# Patient Record
Sex: Male | Born: 1940 | Race: White | Hispanic: No | Smoking: Current every day smoker
Health system: Southern US, Community
[De-identification: ages and names within clinical notes are randomized; demographics above are authoritative.]

## PROBLEM LIST (undated history)

## (undated) DIAGNOSIS — F32A Depression, unspecified: Secondary | ICD-10-CM

## (undated) DIAGNOSIS — F419 Anxiety disorder, unspecified: Secondary | ICD-10-CM

## (undated) DIAGNOSIS — H269 Unspecified cataract: Secondary | ICD-10-CM

## (undated) DIAGNOSIS — T7840XA Allergy, unspecified, initial encounter: Secondary | ICD-10-CM

## (undated) DIAGNOSIS — F329 Major depressive disorder, single episode, unspecified: Secondary | ICD-10-CM

## (undated) DIAGNOSIS — L309 Dermatitis, unspecified: Secondary | ICD-10-CM

## (undated) DIAGNOSIS — F0390 Unspecified dementia without behavioral disturbance: Secondary | ICD-10-CM

## (undated) DIAGNOSIS — Z720 Tobacco use: Secondary | ICD-10-CM

## (undated) HISTORY — DX: Depression, unspecified: F32.A

## (undated) HISTORY — DX: Anxiety disorder, unspecified: F41.9

## (undated) HISTORY — DX: Major depressive disorder, single episode, unspecified: F32.9

## (undated) HISTORY — DX: Unspecified cataract: H26.9

## (undated) HISTORY — DX: Allergy, unspecified, initial encounter: T78.40XA

---

## 2014-05-14 ENCOUNTER — Emergency Department (HOSPITAL_COMMUNITY)
Admission: EM | Admit: 2014-05-14 | Discharge: 2014-05-14 | Disposition: A | Discharge status: Home / Self Care | Payer: Medicare Other | Attending: Emergency Medicine | Admitting: Emergency Medicine

## 2014-05-14 ENCOUNTER — Encounter (HOSPITAL_COMMUNITY): Payer: Self-pay | Admitting: Emergency Medicine

## 2014-05-14 DIAGNOSIS — T24211A Burn of second degree of right thigh, initial encounter: Secondary | ICD-10-CM | POA: Insufficient documentation

## 2014-05-14 DIAGNOSIS — W868XXA Exposure to other electric current, initial encounter: Secondary | ICD-10-CM | POA: Insufficient documentation

## 2014-05-14 DIAGNOSIS — Z720 Tobacco use: Secondary | ICD-10-CM | POA: Diagnosis not present

## 2014-05-14 DIAGNOSIS — Y92092 Bedroom in other non-institutional residence as the place of occurrence of the external cause: Secondary | ICD-10-CM | POA: Insufficient documentation

## 2014-05-14 DIAGNOSIS — Y9389 Activity, other specified: Secondary | ICD-10-CM | POA: Diagnosis not present

## 2014-05-14 DIAGNOSIS — T23002A Burn of unspecified degree of left hand, unspecified site, initial encounter: Secondary | ICD-10-CM | POA: Diagnosis present

## 2014-05-14 DIAGNOSIS — Y998 Other external cause status: Secondary | ICD-10-CM | POA: Diagnosis not present

## 2014-05-14 DIAGNOSIS — T23302A Burn of third degree of left hand, unspecified site, initial encounter: Secondary | ICD-10-CM | POA: Insufficient documentation

## 2014-05-14 MED ORDER — HYDROCODONE-ACETAMINOPHEN 5-325 MG PO TABS: 1.0000 | ORAL_TABLET | ORAL | Status: DC | PRN

## 2014-05-14 MED ORDER — SILVER SULFADIAZINE 1 % EX CREA
TOPICAL_CREAM | Freq: Once | CUTANEOUS | Status: AC
  Administered 2014-05-14: 3 via TOPICAL
  Filled 2014-05-14: qty 50

## 2014-05-14 MED ORDER — IBUPROFEN 600 MG PO TABS: 600.0000 mg | ORAL_TABLET | Freq: Four times a day (QID) | ORAL | Status: DC | PRN

## 2014-05-14 MED ORDER — SILVER SULFADIAZINE 1 % EX CREA: 1.0000 "application " | TOPICAL_CREAM | Freq: Every day | CUTANEOUS | Status: DC

## 2014-05-14 MED ORDER — HYDROCODONE-ACETAMINOPHEN 5-325 MG PO TABS
2.0000 | ORAL_TABLET | Freq: Once | ORAL | Status: AC
Start: 2014-05-14 — End: 2014-05-14
  Administered 2014-05-14: 2 via ORAL
  Filled 2014-05-14: qty 2

## 2014-05-14 MED ORDER — IBUPROFEN 800 MG PO TABS
800.0000 mg | ORAL_TABLET | Freq: Once | ORAL | Status: AC
  Administered 2014-05-14: 800 mg via ORAL
  Filled 2014-05-14: qty 1
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
## 2014-05-14 NOTE — Discharge Instructions (Signed)
Burn Care Your skin is a natural barrier to infection. It is the largest organ of your body. Burns damage this natural protection. To help prevent infection, it is very important to follow your caregiver's instructions in the care of your burn. Burns are classified as:  First degree. There is only redness of the skin (erythema). No scarring is expected.  Second degree. There is blistering of the skin. Scarring may occur with deeper burns.  Third degree. All layers of the skin are injured, and scarring is expected. HOME CARE INSTRUCTIONS   Wash your hands well before changing your bandage.  Change your bandage as often as directed by your caregiver.  Remove the old bandage. If the bandage sticks, you may soak it off with cool, clean water.  Cleanse the burn thoroughly but gently with mild soap and water.  Pat the area dry with a clean, dry cloth.  Apply a thin layer of antibacterial cream to the burn.  Apply a clean bandage as instructed by your caregiver.  Keep the bandage as clean and dry as possible.  Elevate the affected area for the first 24 hours, then as instructed by your caregiver.  Only take over-the-counter or prescription medicines for pain, discomfort, or fever as directed by your caregiver. SEEK IMMEDIATE MEDICAL CARE IF:   You develop excessive pain.  You develop redness, tenderness, swelling, or red streaks near the burn.  The burned area develops yellowish-white fluid (pus) or a bad smell.  You have a fever. MAKE SURE YOU:   Understand these instructions.  Will watch your condition.  Will get help right away if you are not doing well or get worse. Document Released: 05/11/2005 Document Revised: 08/03/2011 Document Reviewed: 10/01/2010 ExitCare Patient Information 2015 [employer]. This information is not intended to replace advice given to you by your health care provider. Make sure you discuss any questions you have with your health care  provider.  
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
## 2014-05-25 ENCOUNTER — Ambulatory Visit (INDEPENDENT_AMBULATORY_CARE_PROVIDER_SITE_OTHER): Payer: Managed Care, Other (non HMO) | Admitting: Emergency Medicine

## 2014-05-25 VITALS — BP 124/62 | HR 87 | Temp 97.9°F | Resp 18 | Ht 71.0 in | Wt 169.4 lb

## 2014-05-25 DIAGNOSIS — T23202A Burn of second degree of left hand, unspecified site, initial encounter: Secondary | ICD-10-CM

## 2014-05-25 DIAGNOSIS — Z23 Encounter for immunization: Secondary | ICD-10-CM

## 2014-05-25 DIAGNOSIS — T24201A Burn of second degree of unspecified site of right lower limb, except ankle and foot, initial encounter: Secondary | ICD-10-CM

## 2014-05-25 MED ORDER — SILVER SULFADIAZINE 1 % EX CREA: 1.0000 "application " | TOPICAL_CREAM | Freq: Every day | CUTANEOUS | Status: DC
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
## 2014-05-25 NOTE — Patient Instructions (Signed)
Burn Care Your skin is a natural barrier to infection. It is the largest organ of your body. Burns damage this natural protection. To help prevent infection, it is very important to follow your caregiver's instructions in the care of your burn. Burns are classified as:  First degree. There is only redness of the skin (erythema). No scarring is expected.  Second degree. There is blistering of the skin. Scarring may occur with deeper burns.  Third degree. All layers of the skin are injured, and scarring is expected. HOME CARE INSTRUCTIONS   Wash your hands well before changing your bandage.  Change your bandage as often as directed by your caregiver.  Remove the old bandage. If the bandage sticks, you may soak it off with cool, clean water.  Cleanse the burn thoroughly but gently with mild soap and water.  Pat the area dry with a clean, dry cloth.  Apply a thin layer of antibacterial cream to the burn.  Apply a clean bandage as instructed by your caregiver.  Keep the bandage as clean and dry as possible.  Elevate the affected area for the first 24 hours, then as instructed by your caregiver.  Only take over-the-counter or prescription medicines for pain, discomfort, or fever as directed by your caregiver. SEEK IMMEDIATE MEDICAL CARE IF:   You develop excessive pain.  You develop redness, tenderness, swelling, or red streaks near the burn.  The burned area develops yellowish-white fluid (pus) or a bad smell.  You have a fever. MAKE SURE YOU:   Understand these instructions.  Will watch your condition.  Will get help right away if you are not doing well or get worse. Document Released: 05/11/2005 Document Revised: 08/03/2011 Document Reviewed: 10/01/2010 Exit[employer] Patient Information 2015 [employer]. This information is not intended to replace advice given to you by your health care provider. Make sure you discuss any questions you have with your health care  provider.  

## 2015-10-15 DIAGNOSIS — Z0000 Encounter for general adult medical examination without abnormal findings: Secondary | ICD-10-CM | POA: Diagnosis not present

## 2015-10-15 DIAGNOSIS — Z6824 Body mass index (BMI) 24.0-24.9, adult: Secondary | ICD-10-CM | POA: Diagnosis not present

## 2016-05-06 ENCOUNTER — Ambulatory Visit: Payer: Self-pay | Admitting: Family Medicine

## 2016-06-13 DIAGNOSIS — Z0000 Encounter for general adult medical examination without abnormal findings: Secondary | ICD-10-CM | POA: Diagnosis not present

## 2016-06-13 DIAGNOSIS — G3184 Mild cognitive impairment, so stated: Secondary | ICD-10-CM | POA: Diagnosis not present

## 2016-06-13 DIAGNOSIS — R69 Illness, unspecified: Secondary | ICD-10-CM | POA: Diagnosis not present

## 2016-06-13 DIAGNOSIS — Z972 Presence of dental prosthetic device (complete) (partial): Secondary | ICD-10-CM | POA: Diagnosis not present

## 2016-06-13 DIAGNOSIS — K08409 Partial loss of teeth, unspecified cause, unspecified class: Secondary | ICD-10-CM | POA: Diagnosis not present

## 2016-06-13 DIAGNOSIS — R030 Elevated blood-pressure reading, without diagnosis of hypertension: Secondary | ICD-10-CM | POA: Diagnosis not present

## 2016-06-13 DIAGNOSIS — Z6823 Body mass index (BMI) 23.0-23.9, adult: Secondary | ICD-10-CM | POA: Diagnosis not present

## 2016-07-08 ENCOUNTER — Ambulatory Visit: Payer: Self-pay | Admitting: Family Medicine

## 2016-08-12 ENCOUNTER — Ambulatory Visit: Payer: Self-pay | Admitting: Family Medicine

## 2016-08-13 ENCOUNTER — Telehealth: Payer: Self-pay | Admitting: General Practice

## 2016-08-13 NOTE — Telephone Encounter (Signed)
Patient was No Show for New Patient appointment 08/12/16, 07/08/16 and 05/06/16.

## 2016-08-13 NOTE — Telephone Encounter (Signed)
Ok- do not reschedule please.

## 2016-08-14 NOTE — Telephone Encounter (Signed)
Patient BLOCKED from scheduling with provider again

## 2017-04-30 DIAGNOSIS — I10 Essential (primary) hypertension: Secondary | ICD-10-CM | POA: Diagnosis not present

## 2017-04-30 DIAGNOSIS — D229 Melanocytic nevi, unspecified: Secondary | ICD-10-CM | POA: Diagnosis not present

## 2017-04-30 DIAGNOSIS — L404 Guttate psoriasis: Secondary | ICD-10-CM | POA: Diagnosis not present

## 2017-04-30 DIAGNOSIS — R69 Illness, unspecified: Secondary | ICD-10-CM | POA: Diagnosis not present

## 2017-04-30 DIAGNOSIS — Z23 Encounter for immunization: Secondary | ICD-10-CM | POA: Diagnosis not present

## 2017-05-10 DIAGNOSIS — D229 Melanocytic nevi, unspecified: Secondary | ICD-10-CM | POA: Diagnosis not present

## 2017-05-10 DIAGNOSIS — Z720 Tobacco use: Secondary | ICD-10-CM | POA: Diagnosis not present

## 2017-05-10 DIAGNOSIS — L821 Other seborrheic keratosis: Secondary | ICD-10-CM | POA: Diagnosis not present

## 2017-05-10 DIAGNOSIS — R69 Illness, unspecified: Secondary | ICD-10-CM | POA: Diagnosis not present

## 2017-05-10 DIAGNOSIS — Z9189 Other specified personal risk factors, not elsewhere classified: Secondary | ICD-10-CM | POA: Diagnosis not present

## 2017-05-10 DIAGNOSIS — R531 Weakness: Secondary | ICD-10-CM | POA: Diagnosis not present

## 2017-05-12 DIAGNOSIS — R488 Other symbolic dysfunctions: Secondary | ICD-10-CM | POA: Diagnosis not present

## 2017-05-12 DIAGNOSIS — R2681 Unsteadiness on feet: Secondary | ICD-10-CM | POA: Diagnosis not present

## 2017-05-12 DIAGNOSIS — R278 Other lack of coordination: Secondary | ICD-10-CM | POA: Diagnosis not present

## 2017-05-12 DIAGNOSIS — M6281 Muscle weakness (generalized): Secondary | ICD-10-CM | POA: Diagnosis not present

## 2017-05-13 DIAGNOSIS — Z720 Tobacco use: Secondary | ICD-10-CM | POA: Diagnosis not present

## 2017-05-13 DIAGNOSIS — R69 Illness, unspecified: Secondary | ICD-10-CM | POA: Diagnosis not present

## 2017-05-13 DIAGNOSIS — Z0001 Encounter for general adult medical examination with abnormal findings: Secondary | ICD-10-CM | POA: Diagnosis not present

## 2017-05-14 DIAGNOSIS — M6281 Muscle weakness (generalized): Secondary | ICD-10-CM | POA: Diagnosis not present

## 2017-05-14 DIAGNOSIS — R278 Other lack of coordination: Secondary | ICD-10-CM | POA: Diagnosis not present

## 2017-05-14 DIAGNOSIS — R488 Other symbolic dysfunctions: Secondary | ICD-10-CM | POA: Diagnosis not present

## 2017-05-14 DIAGNOSIS — R2681 Unsteadiness on feet: Secondary | ICD-10-CM | POA: Diagnosis not present

## 2017-05-17 DIAGNOSIS — R488 Other symbolic dysfunctions: Secondary | ICD-10-CM | POA: Diagnosis not present

## 2017-05-17 DIAGNOSIS — M6281 Muscle weakness (generalized): Secondary | ICD-10-CM | POA: Diagnosis not present

## 2017-05-17 DIAGNOSIS — R278 Other lack of coordination: Secondary | ICD-10-CM | POA: Diagnosis not present

## 2017-05-17 DIAGNOSIS — R2681 Unsteadiness on feet: Secondary | ICD-10-CM | POA: Diagnosis not present

## 2017-05-19 DIAGNOSIS — R278 Other lack of coordination: Secondary | ICD-10-CM | POA: Diagnosis not present

## 2017-05-19 DIAGNOSIS — R2681 Unsteadiness on feet: Secondary | ICD-10-CM | POA: Diagnosis not present

## 2017-05-19 DIAGNOSIS — M6281 Muscle weakness (generalized): Secondary | ICD-10-CM | POA: Diagnosis not present

## 2017-05-19 DIAGNOSIS — R488 Other symbolic dysfunctions: Secondary | ICD-10-CM | POA: Diagnosis not present

## 2017-05-20 DIAGNOSIS — R2681 Unsteadiness on feet: Secondary | ICD-10-CM | POA: Diagnosis not present

## 2017-05-20 DIAGNOSIS — M6281 Muscle weakness (generalized): Secondary | ICD-10-CM | POA: Diagnosis not present

## 2017-05-20 DIAGNOSIS — R278 Other lack of coordination: Secondary | ICD-10-CM | POA: Diagnosis not present

## 2017-05-20 DIAGNOSIS — R488 Other symbolic dysfunctions: Secondary | ICD-10-CM | POA: Diagnosis not present

## 2017-05-21 DIAGNOSIS — R488 Other symbolic dysfunctions: Secondary | ICD-10-CM | POA: Diagnosis not present

## 2017-05-21 DIAGNOSIS — M6281 Muscle weakness (generalized): Secondary | ICD-10-CM | POA: Diagnosis not present

## 2017-05-21 DIAGNOSIS — R2681 Unsteadiness on feet: Secondary | ICD-10-CM | POA: Diagnosis not present

## 2017-05-21 DIAGNOSIS — R278 Other lack of coordination: Secondary | ICD-10-CM | POA: Diagnosis not present

## 2017-05-24 DIAGNOSIS — R278 Other lack of coordination: Secondary | ICD-10-CM | POA: Diagnosis not present

## 2017-05-24 DIAGNOSIS — R2681 Unsteadiness on feet: Secondary | ICD-10-CM | POA: Diagnosis not present

## 2017-05-24 DIAGNOSIS — R488 Other symbolic dysfunctions: Secondary | ICD-10-CM | POA: Diagnosis not present

## 2017-05-24 DIAGNOSIS — M6281 Muscle weakness (generalized): Secondary | ICD-10-CM | POA: Diagnosis not present

## 2017-05-31 DIAGNOSIS — M6281 Muscle weakness (generalized): Secondary | ICD-10-CM | POA: Diagnosis not present

## 2017-05-31 DIAGNOSIS — R278 Other lack of coordination: Secondary | ICD-10-CM | POA: Diagnosis not present

## 2017-05-31 DIAGNOSIS — R2681 Unsteadiness on feet: Secondary | ICD-10-CM | POA: Diagnosis not present

## 2017-05-31 DIAGNOSIS — R488 Other symbolic dysfunctions: Secondary | ICD-10-CM | POA: Diagnosis not present

## 2017-06-01 DIAGNOSIS — R488 Other symbolic dysfunctions: Secondary | ICD-10-CM | POA: Diagnosis not present

## 2017-06-01 DIAGNOSIS — R278 Other lack of coordination: Secondary | ICD-10-CM | POA: Diagnosis not present

## 2017-06-01 DIAGNOSIS — R2681 Unsteadiness on feet: Secondary | ICD-10-CM | POA: Diagnosis not present

## 2017-06-01 DIAGNOSIS — M6281 Muscle weakness (generalized): Secondary | ICD-10-CM | POA: Diagnosis not present

## 2017-06-02 DIAGNOSIS — M6281 Muscle weakness (generalized): Secondary | ICD-10-CM | POA: Diagnosis not present

## 2017-06-02 DIAGNOSIS — R2681 Unsteadiness on feet: Secondary | ICD-10-CM | POA: Diagnosis not present

## 2017-06-02 DIAGNOSIS — R488 Other symbolic dysfunctions: Secondary | ICD-10-CM | POA: Diagnosis not present

## 2017-06-02 DIAGNOSIS — R278 Other lack of coordination: Secondary | ICD-10-CM | POA: Diagnosis not present

## 2017-06-21 DIAGNOSIS — D229 Melanocytic nevi, unspecified: Secondary | ICD-10-CM | POA: Diagnosis not present

## 2017-06-21 DIAGNOSIS — L720 Epidermal cyst: Secondary | ICD-10-CM | POA: Diagnosis not present

## 2017-06-21 DIAGNOSIS — R69 Illness, unspecified: Secondary | ICD-10-CM | POA: Diagnosis not present

## 2017-06-22 DIAGNOSIS — R2681 Unsteadiness on feet: Secondary | ICD-10-CM | POA: Diagnosis not present

## 2017-06-22 DIAGNOSIS — R278 Other lack of coordination: Secondary | ICD-10-CM | POA: Diagnosis not present

## 2017-06-22 DIAGNOSIS — M6281 Muscle weakness (generalized): Secondary | ICD-10-CM | POA: Diagnosis not present

## 2017-06-22 DIAGNOSIS — R488 Other symbolic dysfunctions: Secondary | ICD-10-CM | POA: Diagnosis not present

## 2017-06-24 DIAGNOSIS — R2681 Unsteadiness on feet: Secondary | ICD-10-CM | POA: Diagnosis not present

## 2017-06-24 DIAGNOSIS — R278 Other lack of coordination: Secondary | ICD-10-CM | POA: Diagnosis not present

## 2017-06-24 DIAGNOSIS — M6281 Muscle weakness (generalized): Secondary | ICD-10-CM | POA: Diagnosis not present

## 2017-06-24 DIAGNOSIS — R488 Other symbolic dysfunctions: Secondary | ICD-10-CM | POA: Diagnosis not present

## 2017-06-28 DIAGNOSIS — M6281 Muscle weakness (generalized): Secondary | ICD-10-CM | POA: Diagnosis not present

## 2017-06-28 DIAGNOSIS — R2681 Unsteadiness on feet: Secondary | ICD-10-CM | POA: Diagnosis not present

## 2017-06-28 DIAGNOSIS — R488 Other symbolic dysfunctions: Secondary | ICD-10-CM | POA: Diagnosis not present

## 2017-06-28 DIAGNOSIS — R278 Other lack of coordination: Secondary | ICD-10-CM | POA: Diagnosis not present

## 2017-06-29 DIAGNOSIS — L570 Actinic keratosis: Secondary | ICD-10-CM | POA: Diagnosis not present

## 2017-06-29 DIAGNOSIS — L989 Disorder of the skin and subcutaneous tissue, unspecified: Secondary | ICD-10-CM | POA: Diagnosis not present

## 2017-06-29 DIAGNOSIS — D229 Melanocytic nevi, unspecified: Secondary | ICD-10-CM | POA: Diagnosis not present

## 2017-06-29 DIAGNOSIS — L821 Other seborrheic keratosis: Secondary | ICD-10-CM | POA: Diagnosis not present

## 2017-06-29 DIAGNOSIS — L118 Other specified acantholytic disorders: Secondary | ICD-10-CM | POA: Diagnosis not present

## 2017-06-29 DIAGNOSIS — R69 Illness, unspecified: Secondary | ICD-10-CM | POA: Diagnosis not present

## 2017-06-29 DIAGNOSIS — D225 Melanocytic nevi of trunk: Secondary | ICD-10-CM | POA: Diagnosis not present

## 2017-06-30 DIAGNOSIS — M6281 Muscle weakness (generalized): Secondary | ICD-10-CM | POA: Diagnosis not present

## 2017-06-30 DIAGNOSIS — R488 Other symbolic dysfunctions: Secondary | ICD-10-CM | POA: Diagnosis not present

## 2017-06-30 DIAGNOSIS — R278 Other lack of coordination: Secondary | ICD-10-CM | POA: Diagnosis not present

## 2017-06-30 DIAGNOSIS — R2681 Unsteadiness on feet: Secondary | ICD-10-CM | POA: Diagnosis not present

## 2017-07-06 DIAGNOSIS — L988 Other specified disorders of the skin and subcutaneous tissue: Secondary | ICD-10-CM | POA: Diagnosis not present

## 2017-07-06 DIAGNOSIS — D229 Melanocytic nevi, unspecified: Secondary | ICD-10-CM | POA: Diagnosis not present

## 2017-07-06 DIAGNOSIS — M6281 Muscle weakness (generalized): Secondary | ICD-10-CM | POA: Diagnosis not present

## 2017-07-06 DIAGNOSIS — Z720 Tobacco use: Secondary | ICD-10-CM | POA: Diagnosis not present

## 2017-07-06 DIAGNOSIS — R278 Other lack of coordination: Secondary | ICD-10-CM | POA: Diagnosis not present

## 2017-07-06 DIAGNOSIS — L570 Actinic keratosis: Secondary | ICD-10-CM | POA: Diagnosis not present

## 2017-07-06 DIAGNOSIS — R488 Other symbolic dysfunctions: Secondary | ICD-10-CM | POA: Diagnosis not present

## 2017-07-06 DIAGNOSIS — R2681 Unsteadiness on feet: Secondary | ICD-10-CM | POA: Diagnosis not present

## 2017-07-06 DIAGNOSIS — R69 Illness, unspecified: Secondary | ICD-10-CM | POA: Diagnosis not present

## 2017-07-08 DIAGNOSIS — M6281 Muscle weakness (generalized): Secondary | ICD-10-CM | POA: Diagnosis not present

## 2017-07-08 DIAGNOSIS — R2681 Unsteadiness on feet: Secondary | ICD-10-CM | POA: Diagnosis not present

## 2017-07-08 DIAGNOSIS — R488 Other symbolic dysfunctions: Secondary | ICD-10-CM | POA: Diagnosis not present

## 2017-07-08 DIAGNOSIS — R278 Other lack of coordination: Secondary | ICD-10-CM | POA: Diagnosis not present

## 2017-07-13 DIAGNOSIS — R278 Other lack of coordination: Secondary | ICD-10-CM | POA: Diagnosis not present

## 2017-07-13 DIAGNOSIS — R488 Other symbolic dysfunctions: Secondary | ICD-10-CM | POA: Diagnosis not present

## 2017-07-13 DIAGNOSIS — R2681 Unsteadiness on feet: Secondary | ICD-10-CM | POA: Diagnosis not present

## 2017-07-13 DIAGNOSIS — M6281 Muscle weakness (generalized): Secondary | ICD-10-CM | POA: Diagnosis not present

## 2017-07-15 DIAGNOSIS — R488 Other symbolic dysfunctions: Secondary | ICD-10-CM | POA: Diagnosis not present

## 2017-07-15 DIAGNOSIS — R2681 Unsteadiness on feet: Secondary | ICD-10-CM | POA: Diagnosis not present

## 2017-07-15 DIAGNOSIS — M6281 Muscle weakness (generalized): Secondary | ICD-10-CM | POA: Diagnosis not present

## 2017-07-15 DIAGNOSIS — R278 Other lack of coordination: Secondary | ICD-10-CM | POA: Diagnosis not present

## 2017-08-04 DIAGNOSIS — R278 Other lack of coordination: Secondary | ICD-10-CM | POA: Diagnosis not present

## 2017-08-04 DIAGNOSIS — R488 Other symbolic dysfunctions: Secondary | ICD-10-CM | POA: Diagnosis not present

## 2017-08-04 DIAGNOSIS — M6281 Muscle weakness (generalized): Secondary | ICD-10-CM | POA: Diagnosis not present

## 2017-08-04 DIAGNOSIS — R2681 Unsteadiness on feet: Secondary | ICD-10-CM | POA: Diagnosis not present

## 2017-08-05 DIAGNOSIS — M6281 Muscle weakness (generalized): Secondary | ICD-10-CM | POA: Diagnosis not present

## 2017-08-05 DIAGNOSIS — R2681 Unsteadiness on feet: Secondary | ICD-10-CM | POA: Diagnosis not present

## 2017-08-05 DIAGNOSIS — R488 Other symbolic dysfunctions: Secondary | ICD-10-CM | POA: Diagnosis not present

## 2017-08-05 DIAGNOSIS — R278 Other lack of coordination: Secondary | ICD-10-CM | POA: Diagnosis not present

## 2017-08-10 DIAGNOSIS — R488 Other symbolic dysfunctions: Secondary | ICD-10-CM | POA: Diagnosis not present

## 2017-08-10 DIAGNOSIS — M6281 Muscle weakness (generalized): Secondary | ICD-10-CM | POA: Diagnosis not present

## 2017-08-10 DIAGNOSIS — R278 Other lack of coordination: Secondary | ICD-10-CM | POA: Diagnosis not present

## 2017-08-10 DIAGNOSIS — R2681 Unsteadiness on feet: Secondary | ICD-10-CM | POA: Diagnosis not present

## 2017-09-23 ENCOUNTER — Encounter (HOSPITAL_COMMUNITY): Payer: Self-pay | Admitting: Emergency Medicine

## 2017-09-23 ENCOUNTER — Emergency Department (HOSPITAL_COMMUNITY): Payer: Medicare HMO

## 2017-09-23 ENCOUNTER — Inpatient Hospital Stay (HOSPITAL_COMMUNITY)
Admission: EM | Admit: 2017-09-23 | Discharge: 2017-10-02 | DRG: 054 | Disposition: A | Discharge status: Hospice | Payer: Medicare HMO | Attending: Internal Medicine | Admitting: Internal Medicine

## 2017-09-23 ENCOUNTER — Inpatient Hospital Stay (HOSPITAL_COMMUNITY): Payer: Medicare HMO

## 2017-09-23 ENCOUNTER — Other Ambulatory Visit: Payer: Self-pay

## 2017-09-23 DIAGNOSIS — Z720 Tobacco use: Secondary | ICD-10-CM | POA: Diagnosis not present

## 2017-09-23 DIAGNOSIS — I6789 Other cerebrovascular disease: Secondary | ICD-10-CM | POA: Diagnosis not present

## 2017-09-23 DIAGNOSIS — R9401 Abnormal electroencephalogram [EEG]: Secondary | ICD-10-CM | POA: Diagnosis present

## 2017-09-23 DIAGNOSIS — F329 Major depressive disorder, single episode, unspecified: Secondary | ICD-10-CM | POA: Diagnosis present

## 2017-09-23 DIAGNOSIS — G40901 Epilepsy, unspecified, not intractable, with status epilepticus: Secondary | ICD-10-CM | POA: Diagnosis present

## 2017-09-23 DIAGNOSIS — G9341 Metabolic encephalopathy: Secondary | ICD-10-CM | POA: Diagnosis not present

## 2017-09-23 DIAGNOSIS — R414 Neurologic neglect syndrome: Secondary | ICD-10-CM | POA: Diagnosis present

## 2017-09-23 DIAGNOSIS — Z66 Do not resuscitate: Secondary | ICD-10-CM | POA: Diagnosis present

## 2017-09-23 DIAGNOSIS — G9389 Other specified disorders of brain: Secondary | ICD-10-CM | POA: Diagnosis present

## 2017-09-23 DIAGNOSIS — R4702 Dysphasia: Secondary | ICD-10-CM | POA: Diagnosis present

## 2017-09-23 DIAGNOSIS — Z781 Physical restraint status: Secondary | ICD-10-CM | POA: Diagnosis not present

## 2017-09-23 DIAGNOSIS — Z8249 Family history of ischemic heart disease and other diseases of the circulatory system: Secondary | ICD-10-CM | POA: Diagnosis not present

## 2017-09-23 DIAGNOSIS — G936 Cerebral edema: Secondary | ICD-10-CM | POA: Diagnosis present

## 2017-09-23 DIAGNOSIS — I4510 Unspecified right bundle-branch block: Secondary | ICD-10-CM | POA: Diagnosis present

## 2017-09-23 DIAGNOSIS — F0391 Unspecified dementia with behavioral disturbance: Secondary | ICD-10-CM | POA: Diagnosis present

## 2017-09-23 DIAGNOSIS — F419 Anxiety disorder, unspecified: Secondary | ICD-10-CM | POA: Diagnosis present

## 2017-09-23 DIAGNOSIS — R29810 Facial weakness: Secondary | ICD-10-CM | POA: Diagnosis present

## 2017-09-23 DIAGNOSIS — R1310 Dysphagia, unspecified: Secondary | ICD-10-CM | POA: Diagnosis present

## 2017-09-23 DIAGNOSIS — R531 Weakness: Secondary | ICD-10-CM | POA: Diagnosis not present

## 2017-09-23 DIAGNOSIS — J9811 Atelectasis: Secondary | ICD-10-CM | POA: Diagnosis not present

## 2017-09-23 DIAGNOSIS — I452 Bifascicular block: Secondary | ICD-10-CM | POA: Diagnosis not present

## 2017-09-23 DIAGNOSIS — R4182 Altered mental status, unspecified: Secondary | ICD-10-CM

## 2017-09-23 DIAGNOSIS — Z515 Encounter for palliative care: Secondary | ICD-10-CM | POA: Diagnosis not present

## 2017-09-23 DIAGNOSIS — Z823 Family history of stroke: Secondary | ICD-10-CM

## 2017-09-23 DIAGNOSIS — R4587 Impulsiveness: Secondary | ICD-10-CM | POA: Diagnosis present

## 2017-09-23 DIAGNOSIS — I639 Cerebral infarction, unspecified: Secondary | ICD-10-CM | POA: Diagnosis not present

## 2017-09-23 DIAGNOSIS — R451 Restlessness and agitation: Secondary | ICD-10-CM | POA: Diagnosis not present

## 2017-09-23 DIAGNOSIS — R69 Illness, unspecified: Secondary | ICD-10-CM | POA: Diagnosis not present

## 2017-09-23 DIAGNOSIS — L309 Dermatitis, unspecified: Secondary | ICD-10-CM

## 2017-09-23 DIAGNOSIS — K869 Disease of pancreas, unspecified: Secondary | ICD-10-CM | POA: Diagnosis not present

## 2017-09-23 DIAGNOSIS — C719 Malignant neoplasm of brain, unspecified: Secondary | ICD-10-CM | POA: Diagnosis not present

## 2017-09-23 DIAGNOSIS — D496 Neoplasm of unspecified behavior of brain: Secondary | ICD-10-CM | POA: Diagnosis not present

## 2017-09-23 DIAGNOSIS — R569 Unspecified convulsions: Secondary | ICD-10-CM

## 2017-09-23 DIAGNOSIS — G939 Disorder of brain, unspecified: Secondary | ICD-10-CM | POA: Diagnosis not present

## 2017-09-23 DIAGNOSIS — F0390 Unspecified dementia without behavioral disturbance: Secondary | ICD-10-CM | POA: Diagnosis present

## 2017-09-23 DIAGNOSIS — F17210 Nicotine dependence, cigarettes, uncomplicated: Secondary | ICD-10-CM | POA: Diagnosis present

## 2017-09-23 DIAGNOSIS — D332 Benign neoplasm of brain, unspecified: Secondary | ICD-10-CM | POA: Diagnosis not present

## 2017-09-23 DIAGNOSIS — Z7189 Other specified counseling: Secondary | ICD-10-CM | POA: Diagnosis not present

## 2017-09-23 HISTORY — DX: Unspecified dementia, unspecified severity, without behavioral disturbance, psychotic disturbance, mood disturbance, and anxiety: F03.90

## 2017-09-23 HISTORY — DX: Dermatitis, unspecified: L30.9

## 2017-09-23 HISTORY — DX: Tobacco use: Z72.0

## 2017-09-23 LAB — I-STAT TROPONIN, ED: Troponin i, poc: 0.01 ng/mL (ref 0.00–0.08)

## 2017-09-23 LAB — I-STAT CHEM 8, ED
BUN: 18 mg/dL (ref 6–20)
CALCIUM ION: 1.1 mmol/L — AB (ref 1.15–1.40)
CHLORIDE: 101 mmol/L (ref 101–111)
Creatinine, Ser: 1.1 mg/dL (ref 0.61–1.24)
GLUCOSE: 102 mg/dL — AB (ref 65–99)
HCT: 43 % (ref 39.0–52.0)
Hemoglobin: 14.6 g/dL (ref 13.0–17.0)
Potassium: 4.7 mmol/L (ref 3.5–5.1)
Sodium: 139 mmol/L (ref 135–145)
TCO2: 28 mmol/L (ref 22–32)

## 2017-09-23 LAB — COMPREHENSIVE METABOLIC PANEL
ALBUMIN: 3.9 g/dL (ref 3.5–5.0)
ALT: 18 U/L (ref 17–63)
ANION GAP: 8 (ref 5–15)
AST: 31 U/L (ref 15–41)
Alkaline Phosphatase: 106 U/L (ref 38–126)
BILIRUBIN TOTAL: 1 mg/dL (ref 0.3–1.2)
BUN: 14 mg/dL (ref 6–20)
CO2: 29 mmol/L (ref 22–32)
Calcium: 9.4 mg/dL (ref 8.9–10.3)
Chloride: 102 mmol/L (ref 101–111)
Creatinine, Ser: 1.14 mg/dL (ref 0.61–1.24)
GFR calc non Af Amer: >60 mL/min (ref >60)
GLUCOSE: 103 mg/dL — AB (ref 65–99)
Potassium: 4.8 mmol/L (ref 3.5–5.1)
Sodium: 139 mmol/L (ref 135–145)
TOTAL PROTEIN: 7.9 g/dL (ref 6.5–8.1)

## 2017-09-23 LAB — DIFFERENTIAL
Basophils Absolute: 0 10*3/uL (ref 0.0–0.1)
Basophils Relative: 0 %
EOS PCT: 0 %
Eosinophils Absolute: 0 10*3/uL (ref 0.0–0.7)
LYMPHS ABS: 1.5 10*3/uL (ref 0.7–4.0)
LYMPHS PCT: 14 %
Monocytes Absolute: 0.7 10*3/uL (ref 0.1–1.0)
Monocytes Relative: 6 %
NEUTROS PCT: 80 %
Neutro Abs: 8.3 10*3/uL — ABNORMAL HIGH (ref 1.7–7.7)

## 2017-09-23 LAB — APTT: aPTT: 34 seconds (ref 24–36)

## 2017-09-23 LAB — PROTIME-INR
INR: 1.12
Prothrombin Time: 14.3 seconds (ref 11.4–15.2)

## 2017-09-23 LAB — CBC
HCT: 41.7 % (ref 39.0–52.0)
HEMOGLOBIN: 13.9 g/dL (ref 13.0–17.0)
MCH: 31 pg (ref 26.0–34.0)
MCHC: 33.3 g/dL (ref 30.0–36.0)
MCV: 92.9 fL (ref 78.0–100.0)
Platelets: 356 10*3/uL (ref 150–400)
RBC: 4.49 MIL/uL (ref 4.22–5.81)
RDW: 12.5 % (ref 11.5–15.5)
WBC: 10.4 10*3/uL (ref 4.0–10.5)

## 2017-09-23 LAB — CBG MONITORING, ED: GLUCOSE-CAPILLARY: 96 mg/dL (ref 65–99)

## 2017-09-23 MED ORDER — IOHEXOL 300 MG/ML  SOLN
100.0000 mL | Freq: Once | INTRAMUSCULAR | Status: AC | PRN
  Administered 2017-09-23: 100 mL via INTRAVENOUS

## 2017-09-23 MED ORDER — ACETAMINOPHEN 325 MG PO TABS
650.0000 mg | ORAL_TABLET | Freq: Four times a day (QID) | ORAL | Status: DC | PRN
  Administered 2017-09-30 – 2017-10-02 (×2): 650 mg via ORAL
  Filled 2017-09-23 (×2): qty 2

## 2017-09-23 MED ORDER — NICOTINE 21 MG/24HR TD PT24
21.0000 mg | MEDICATED_PATCH | Freq: Every day | TRANSDERMAL | Status: DC
  Administered 2017-09-23 – 2017-10-02 (×10): 21 mg via TRANSDERMAL
  Filled 2017-09-23 (×10): qty 1

## 2017-09-23 MED ORDER — LEVETIRACETAM IN NACL 500 MG/100ML IV SOLN
500.0000 mg | Freq: Two times a day (BID) | INTRAVENOUS | Status: DC
  Administered 2017-09-24 – 2017-09-25 (×3): 500 mg via INTRAVENOUS
  Filled 2017-09-23 (×4): qty 100

## 2017-09-23 MED ORDER — HEPARIN SODIUM (PORCINE) 5000 UNIT/ML IJ SOLN
5000.0000 [IU] | Freq: Three times a day (TID) | INTRAMUSCULAR | Status: DC
  Administered 2017-09-23 – 2017-09-30 (×19): 5000 [IU] via SUBCUTANEOUS
  Filled 2017-09-23 (×19): qty 1

## 2017-09-23 MED ORDER — SODIUM CHLORIDE 0.9 % IV SOLN
INTRAVENOUS | Status: DC
  Administered 2017-09-23: 23:00:00 via INTRAVENOUS

## 2017-09-23 MED ORDER — DEXAMETHASONE SODIUM PHOSPHATE 10 MG/ML IJ SOLN: 10.0000 mg | Freq: Once | INTRAMUSCULAR | Status: DC

## 2017-09-23 MED ORDER — DEXAMETHASONE SODIUM PHOSPHATE 10 MG/ML IJ SOLN
6.0000 mg | Freq: Four times a day (QID) | INTRAMUSCULAR | Status: DC
Start: 2017-09-24 — End: 2017-09-30
  Administered 2017-09-23 – 2017-09-30 (×26): 6 mg via INTRAVENOUS
  Filled 2017-09-23 (×25): qty 1

## 2017-09-23 MED ORDER — LORAZEPAM 2 MG/ML IJ SOLN
1.0000 mg | Freq: Once | INTRAMUSCULAR | Status: AC
  Administered 2017-09-23: 1 mg via INTRAVENOUS
  Filled 2017-09-23: qty 1

## 2017-09-23 MED ORDER — ONDANSETRON HCL 4 MG PO TABS: 4.0000 mg | ORAL_TABLET | Freq: Four times a day (QID) | ORAL | Status: DC | PRN

## 2017-09-23 MED ORDER — DEXAMETHASONE SODIUM PHOSPHATE 10 MG/ML IJ SOLN
10.0000 mg | Freq: Once | INTRAMUSCULAR | Status: AC
  Administered 2017-09-23: 10 mg via INTRAVENOUS
  Filled 2017-09-23: qty 1

## 2017-09-23 MED ORDER — SILVER SULFADIAZINE 1 % EX CREA
1.0000 | TOPICAL_CREAM | Freq: Every day | CUTANEOUS | Status: DC
Start: 2017-09-23 — End: 2017-09-23

## 2017-09-23 MED ORDER — ONDANSETRON HCL 4 MG/2ML IJ SOLN: 4.0000 mg | Freq: Four times a day (QID) | INTRAMUSCULAR | Status: DC | PRN

## 2017-09-23 MED ORDER — ACETAMINOPHEN 650 MG RE SUPP: 650.0000 mg | Freq: Four times a day (QID) | RECTAL | Status: DC | PRN

## 2017-09-23 MED ORDER — LEVETIRACETAM IN NACL 1000 MG/100ML IV SOLN
1000.0000 mg | Freq: Once | INTRAVENOUS | Status: AC
  Administered 2017-09-23: 1000 mg via INTRAVENOUS
  Filled 2017-09-23: qty 100

## 2017-09-23 MED ORDER — LORAZEPAM 2 MG/ML IJ SOLN
1.0000 mg | INTRAMUSCULAR | Status: DC | PRN
  Administered 2017-09-25: 1 mg via INTRAVENOUS

## 2017-09-23 NOTE — Consult Note (Signed)
Neurology Consultation Reason for Consult: Left-sided weakness Referring Physician: Gloris Manchester  CC: Left-sided weakness  History is obtained from: Patient  HPI: Jonathon Keller is a 77 y.o. male history of several months of progressive cognitive decline and more rapid decline over the past week.  His sister states that she is on 2 days ago when he seemed quite confused, possibly with some facial droop even as early as yesterday.  Today, he definitely had some facial droop earlier, but was able to walk and go to the cafeteria, but was not acting his normal self.  Subsequently he was found with severe left-sided weakness and be quite confused.  Also of note, his sister reports 2 episodes of facial twitching lasting approximately 1 minute each.  LKW: Unclear tpa given?: no, not a stroke Premorbid modified rankin scale: 2   ROS: Unable to obtain due to altered mental status.   Past Medical History:  Diagnosis Date  . Allergy   . Anxiety   . Cataract   . Dementia   . Depression      Family History  Problem Relation Age of Onset  . Cancer Mother   . Heart disease Mother   . Cancer Father   . Stroke Father   . Cancer Sister   . Heart disease Brother   . Cancer Paternal Grandmother   . Cancer Paternal Grandfather      Social History:  reports that he has been smoking.  He has been smoking about 0.50 packs per day. He does not have any smokeless tobacco history on file. He reports that he does not drink alcohol or use drugs.   Exam: Current vital signs: BP 129/71   Pulse 76   Temp 98.7 F (37.1 C)   Resp 15   SpO2 100%  Vital signs in last 24 hours: Temp:  [98.7 F (37.1 C)] 98.7 F (37.1 C) (05/02 1728) Pulse Rate:  [76-87] 76 (05/02 1800) Resp:  [15-16] 15 (05/02 1800) BP: (129-147)/(71-88) 129/71 (05/02 1800) SpO2:  [93 %-100 %] 100 % (05/02 1800)   Physical Exam  Constitutional: Appears well-developed and well-nourished.  Psych: Affect appropriate to  situation Eyes: No scleral injection HENT: No OP obstrucion Head: Normocephalic.  Cardiovascular: Normal rate and regular rhythm.  Respiratory: Effort normal, non-labored breathing GI: Soft.  No distension. There is no tenderness.  Skin: He has some bruising  Neuro: Mental Status: Patient is awake, alert, he is unable to answer many questions, but does tell me his name No signs of aphasia, he does not extinguish to double simultaneous stimulation Cranial Nerves: II: I do suspect that he has some mild left visual neglect but he is able to count fingers in all fields. Pupils are equal, round, and reactive to light.   III,IV, VI: He has a right gaze preference but is able to cross midline to the left V: Facial sensation is symmetric to temperature VII: Facial movement is symmetric.  VIII: hearing is intact to voice X: Uvula elevates symmetrically XI: Shoulder shrug is symmetric. XII: tongue is midline without atrophy or fasciculations.  Motor: Tone is normal. Bulk is normal. 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  I suspect this may be responsible for his subacute decline over the past few months.  Unclear if this is primary or metastatic, he will need a work-up for such.  I suspect the episodes of facial twitching or seizure, and I have started him on Keppra and he will receive 1 mg of Ativan.  Recommendations: 1) CT chest abdomen pelvis with contrast 2) Keppra 500 mill grams twice daily following 1 g load 3) if no primary is found, he will need neurosurgical consult for consideration of biopsy 4) Decadron 6 mg every 6 for cerebral  edema 5) EEG in the a.m. 6) neurology to follow   Roland Rack, MD [facility] [phone]  If 7pm- 7am, please page neurology on call as listed in [place].

## 2017-09-23 NOTE — ED Provider Notes (Signed)
[place] EMERGENCY DEPARTMENT Provider Note   CSN: [id number] Arrival date & time: 09/23/17  1705   An emergency department physician performed an initial assessment on this suspected stroke patient at 66.  History   Chief Complaint Chief Complaint  Patient presents with  . Altered Mental Status    HPI Jonathon Keller is a 77 y.o. male.  Patient is a DNR.  Patient with a several month history of progressive cognitive decline and a more rapid decline over the past week.  Certainly some evidence of facial droop as early as yesterday.  Today he had facial droop earlier but was able to walk and go to the cafeteria was not acting his normal self.  Subsequently was found with severe left-sided weakness.  The thought was a stroke he came in as a code stroke.  Patient also quite confused.  Sister reported 2 episodes of facial twitching lasting approximately 1 minute each.  Last known normal is unclear.  Seen by the stroke team upon arrival.  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     Past Medical History:  Diagnosis Date  . Allergy   . Anxiety   . Cataract   . Dementia   . Depression   . Eczema   . Tobacco abuse     Patient Active Problem List   Diagnosis Date Noted  . Dementia 09/23/2017  . Left-sided weakness 09/23/2017  . Brain mass 09/23/2017  . Tobacco abuse   . Eczema   . Acute metabolic encephalopathy     History reviewed. No pertinent surgical history.      Home Medications    Prior to Admission medications   Medication Sig Start Date End Date Taking? Authorizing Provider  HYDROcodone-acetaminophen (NORCO/VICODIN) 5-325 MG per tablet Take 1-2 tablets by mouth every 4 (four) hours as needed for moderate pain or severe pain. Patient not taking: Reported on 05/25/2014 05/14/14   Charlesetta Shanks, MD  ibuprofen (ADVIL,MOTRIN) 600 MG tablet Take 1 tablet (600 mg total) by mouth every 6 (six) hours as needed. Patient not  taking: Reported on 05/25/2014 05/14/14   Charlesetta Shanks, MD  silver sulfADIAZINE (SILVADENE) 1 % cream Apply 1 application topically daily. Patient not taking: Reported on 09/23/2017 05/14/14   Charlesetta Shanks, MD  silver sulfADIAZINE (SILVADENE) 1 % cream Apply 1 application topically daily. Patient not taking: Reported on 09/23/2017 05/25/14   Roselee Culver, MD    Family History Family History  Problem Relation Age of Onset  . Cancer Mother   . Heart disease Mother   . Cancer Father   . Stroke Father   . Cancer Sister   . Heart disease Brother   . Cancer Paternal Grandmother   . Cancer Paternal Grandfather     Social History Social History   Tobacco Use  . Smoking status: Current Every Day Smoker    Packs/day: 0.50  Substance Use Topics  . Alcohol use: No  . Drug use: No     Allergies   Patient has no known allergies.   Review of Systems Review of Systems  Unable to perform ROS: Mental status change     Physical Exam Updated Vital Signs BP 127/70 (BP Location: Left Arm)   Pulse 73   Temp 98 F (36.7 C) (Axillary)   Resp 16   Ht 1.753 m (5\' 9" )   Wt 71.8 kg (158 lb 4.6 oz)   SpO2 100%   BMI 23.38 kg/m  Physical Exam  Constitutional: He appears well-developed and well-nourished. No distress.  HENT:  Head: Normocephalic and atraumatic.  Mucous membranes dry  Eyes: Pupils are equal, round, and reactive to light. Conjunctivae are normal.  Neck: Neck supple.  Cardiovascular: Normal rate, regular rhythm and normal heart sounds.  Pulmonary/Chest: Effort normal and breath sounds normal. No respiratory distress.  Abdominal: Soft. Bowel sounds are normal. There is no tenderness.  Musculoskeletal: He exhibits no edema.  Neurological: He is alert.   Confused moving all 4 extremities spontaneously.  But will not follow commands very well.  Skin: Skin is warm.  Nursing note and vitals reviewed.    ED Treatments / Results  Labs (all labs ordered are  listed, but only abnormal results are displayed) Labs Reviewed  DIFFERENTIAL - Abnormal; Notable for the following components:      Result Value   Neutro Abs 8.3 (*)    All other components within normal limits  COMPREHENSIVE METABOLIC PANEL - Abnormal; Notable for the following components:   Glucose, Bld 103 (*)    All other components within normal limits  I-STAT CHEM 8, ED - Abnormal; Notable for the following components:   Glucose, Bld 102 (*)    Calcium, Ion 1.10 (*)    All other components within normal limits  PROTIME-INR  APTT  CBC  BASIC METABOLIC PANEL  CBC  I-STAT TROPONIN, ED  CBG MONITORING, ED    EKG EKG Interpretation  Date/Time:  Thursday Sep 23 2017 17:31:35 EDT Ventricular Rate:  81 PR Interval:    QRS Duration: 129 QT Interval:  395 QTC Calculation: 459 R Axis:   -70 Text Interpretation:  Sinus rhythm RBBB and LAFB Inferior infarct, old Lateral leads are also involved No previous ECGs available Confirmed by Fredia Sorrow [phone]) on 09/23/2017 8:07:33 PM   Radiology Ct Chest W Contrast  Result Date: 09/23/2017 CLINICAL DATA:  77 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 EXAM: CT CHEST, ABDOMEN, AND PELVIS WITH CONTRAST TECHNIQUE: Multidetector CT imaging of the chest, abdomen and pelvis was performed following the standard protocol during bolus administration of intravenous contrast. CONTRAST:  121mL OMNIPAQUE IOHEXOL 300 MG/ML  SOLN COMPARISON:  None. FINDINGS: CT CHEST FINDINGS Cardiovascular: There is no cardiomegaly or pericardial effusion. Advanced 3 vessel coronary vascular calcification. There is mild atherosclerotic calcification of the thoracic aorta. No aneurysmal dilatation or evidence of dissection. The origins of the great vessels of the aortic arch appear patent as visualized. The central pulmonary arteries are grossly unremarkable. Mediastinum/Nodes: There is no hilar or mediastinal adenopathy. Esophagus is grossly unremarkable as  visualized. There is a 1 cm calcified right thyroid nodule. Lungs/Pleura: Minimal bibasilar atelectatic changes noted. The lungs are otherwise clear. There is no pleural effusion or pneumothorax. The central airways are patent. Musculoskeletal: No chest wall mass or suspicious bone lesions identified. CT ABDOMEN PELVIS FINDINGS No intra-abdominal free air or free fluid. Hepatobiliary: The liver is unremarkable. No intrahepatic biliary ductal dilatation. Mild thickened appearance of the gallbladder wall may be related to incomplete distension or chronic inflammation. There are probable small stones within the gallbladder. No evidence of acute cholecystitis by CT. Ultrasound may provide better evaluation of the gallbladder if clinically indicated. The common bile duct is mildly dilated measuring up to 12 mm in diameter. No calcified stone noted in the central CBD. Pancreas: There are scattered pancreatic calcifications, likely sequela of chronic pancreatitis. Correlation with pancreatic enzymes recommended to exclude acute pancreatitis. Spleen: Normal in size without focal abnormality.  Adrenals/Urinary Tract: The right adrenal gland is unremarkable. There is a 2 cm indeterminate left adrenal nodule. MRI is recommended for further characterization. There is also thickening of the inferior aspect of the lateral limb of the left adrenal gland measuring up to 1.5 cm in thickness. Small left renal cysts. There is no hydronephrosis on either side. The right kidney is unremarkable. The visualized ureters and urinary bladder are unremarkable as well. Stomach/Bowel: There is no bowel obstruction or active inflammation. Normal appendix. Vascular/Lymphatic: There is advanced aortoiliac atherosclerotic disease. The splenic vein, SMV, and main portal vein are patent. No portal venous gas. There is no adenopathy. Reproductive: The prostate gland is enlarged measuring 5.7 cm in diameter. Other: None Musculoskeletal: Mild  degenerative changes. No acute osseous pathology. IMPRESSION: 1. No acute intrathoracic, abdominal, or pelvic pathology. 2. Probable small gallstones. No definite evidence of acute cholecystitis by CT. Ultrasound may provide better evaluation of the gallbladder if clinically indicated. 3. Scattered pancreatic calcifications, likely sequela of chronic pancreatitis. Clinical correlation is recommended. 4. **An incidental finding of potential clinical significance has been found. Left adrenal nodules. Further characterization with MRI, adrenal protocol, on a nonemergent basis recommended.** Electronically Signed   By: Anner Crete M.D.   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 EXAM: CT CHEST, ABDOMEN, AND PELVIS WITH CONTRAST TECHNIQUE: Multidetector CT imaging of the chest, abdomen and pelvis was performed following the standard protocol during bolus administration of intravenous contrast. CONTRAST:  138mL OMNIPAQUE IOHEXOL 300 MG/ML  SOLN COMPARISON:  None. FINDINGS: CT CHEST FINDINGS Cardiovascular: There is no cardiomegaly or pericardial effusion. Advanced 3 vessel coronary vascular calcification. There is mild atherosclerotic calcification of the thoracic aorta. No aneurysmal dilatation or evidence of dissection. The origins of the great vessels of the aortic arch appear patent as visualized. The central pulmonary arteries are grossly unremarkable. Mediastinum/Nodes: There is no hilar or mediastinal adenopathy. Esophagus is grossly unremarkable as visualized. There is a 1 cm calcified right thyroid nodule. Lungs/Pleura: Minimal bibasilar atelectatic changes noted. The lungs are otherwise clear. There is no pleural effusion or pneumothorax. The central airways are patent. Musculoskeletal: No chest wall mass or suspicious bone lesions identified. CT ABDOMEN PELVIS FINDINGS No intra-abdominal free  air or free fluid. Hepatobiliary: The liver is unremarkable. No intrahepatic biliary ductal dilatation. Mild thickened appearance of the gallbladder wall may be related to incomplete distension or chronic inflammation. There are probable small stones within the gallbladder. No evidence of acute cholecystitis by CT. Ultrasound may provide better evaluation of the gallbladder if clinically indicated. The common bile duct is mildly dilated measuring up to 12 mm in diameter. No calcified stone noted in the central CBD. Pancreas: There are scattered pancreatic calcifications, likely sequela of chronic pancreatitis. Correlation with pancreatic enzymes recommended to exclude acute pancreatitis. Spleen: Normal in size without focal abnormality. Adrenals/Urinary Tract: The right adrenal gland is unremarkable. There is a 2 cm indeterminate left adrenal nodule. MRI is recommended for further characterization. There is also thickening of the inferior aspect of the lateral limb of the left adrenal gland measuring up to 1.5 cm in thickness. Small left renal cysts. There is no hydronephrosis on either side. The right kidney is unremarkable. The visualized ureters and urinary bladder are unremarkable as well. Stomach/Bowel: There is no bowel obstruction or active inflammation. Normal appendix. Vascular/Lymphatic: There is advanced aortoiliac atherosclerotic disease. The splenic vein, SMV, and  main portal vein are patent. No portal venous gas. There is no adenopathy. Reproductive: The prostate gland is enlarged measuring 5.7 cm in diameter. Other: None Musculoskeletal: Mild degenerative changes. No acute osseous pathology. IMPRESSION: 1. No acute intrathoracic, abdominal, or pelvic pathology. 2. Probable small gallstones. No definite evidence of acute cholecystitis by CT. Ultrasound may provide better evaluation of the gallbladder if clinically indicated. 3. Scattered pancreatic calcifications, likely sequela of chronic  pancreatitis. Clinical correlation is recommended. 4. **An incidental finding of potential clinical significance has been found. Left adrenal nodules. Further characterization with MRI, adrenal protocol, on a nonemergent basis recommended.** Electronically Signed   By: Anner Crete M.D.   On: 09/23/2017 21:57   Dg Chest Port 1 View  Result Date: 09/23/2017 CLINICAL DATA:  Altered mental status EXAM: PORTABLE CHEST 1 VIEW COMPARISON:  CT brain 09/23/2017 FINDINGS: Carotid vascular calcification. No acute airspace disease or effusion. Normal cardiomediastinal silhouette. No pneumothorax. IMPRESSION: No active disease. Electronically Signed   By: Donavan Foil M.D.   On: 09/23/2017 20:26   Ct Head Code Stroke Wo Contrast  Result Date: 09/23/2017 CLINICAL DATA:  77 y/o M; left-sided weakness and left facial droop. EXAM: CT HEAD WITHOUT CONTRAST TECHNIQUE: Contiguous axial images were obtained from the base of the skull through the vertex without intravenous contrast. COMPARISON:  None. FINDINGS: Brain: Right frontal lobe well-circumscribed heterogeneous mass measuring 2.7 x 2.4 x 2.9 cm (AP x ML x CC series 3, image 22 and series 5, image 29). Extensive surrounding edema throughout the right frontal lobe and insula white matter. Associated mass effect with partial effacement of right lateral ventricle and 4 mm right-to-left midline shift. No herniation. No additional findings for stroke, hemorrhage, or focal mass effect identified. Mild chronic microvascular ischemic changes and parenchymal volume loss of the brain. Vascular: Calcific atherosclerosis of carotid siphons. No hyperdense vessel identified. Skull: Normal. Negative for fracture or focal lesion. Sinuses/Orbits: No acute finding. Other: Bilateral intra-ocular lens replacement. IMPRESSION: 1. 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 This can be further characterized with MRI with and without contrast.  Associated edema and mass effect with 4 mm right-to-left midline shift. 2. No additional finding for stroke, hemorrhage, or focal mass effect. Electronically Signed   By: Kristine Garbe M.D.   On: 09/23/2017 17:34    Procedures Procedures (including critical care time)  CRITICAL CARE Performed by: Fredia Sorrow Total critical care time: 30 minutes Critical care time was exclusive of separately billable procedures and treating other patients. Critical care was necessary to treat or prevent imminent or life-threatening deterioration. Critical care was time spent personally by me on the following activities: development of treatment plan with patient and/or surrogate as well as nursing, discussions with consultants, evaluation of patient's response to treatment, examination of patient, obtaining history from patient or surrogate, ordering and performing treatments and interventions, ordering and review of laboratory studies, ordering and review of radiographic studies, pulse oximetry and re-evaluation of patient's condition.   Medications Ordered in ED Medications  dexamethasone (DECADRON) injection 6 mg (has no administration in time range)  levETIRAcetam (KEPPRA) IVPB 500 mg/100 mL premix (has no administration in time range)  LORazepam (ATIVAN) injection 1 mg (has no administration in time range)  heparin injection 5,000 Units (has no administration in time range)  acetaminophen (TYLENOL) tablet 650 mg (has no administration in time range)    Or  acetaminophen (TYLENOL) suppository 650 mg (has no administration in time range)  ondansetron (ZOFRAN)  tablet 4 mg (has no administration in time range)    Or  ondansetron (ZOFRAN) injection 4 mg (has no administration in time range)  nicotine (NICODERM CQ - dosed in mg/24 hours) patch 21 mg (has no administration in time range)  0.9 %  sodium chloride infusion ( Intravenous New Bag/Given 09/23/17 2303)  levETIRAcetam (KEPPRA) IVPB 1000  mg/100 mL premix (0 mg Intravenous Stopped 09/23/17 1941)  LORazepam (ATIVAN) injection 1 mg (1 mg Intravenous Given 09/23/17 1820)  dexamethasone (DECADRON) injection 10 mg (10 mg Intravenous Given 09/23/17 1823)  iohexol (OMNIPAQUE) 300 MG/ML solution 100 mL (100 mLs Intravenous Contrast Given 09/23/17 2131)     Initial Impression / Assessment and Plan / ED Course  I have reviewed the triage vital signs and the nursing notes.  Pertinent labs & imaging results that were available during my care of the patient were reviewed by me and considered in my medical decision making (see chart for details).     Patient arrived as possible code stroke.  Was having some twitching and reported facial droop.  Seen by stroke team upon arrival.  Last known normal was unclear.  They felt that symptoms were not consistent with a stroke CT scan showed a brain tumor not sure primary or metastatic.  Formally seen by the stroke team Dr. Katherine Roan left note.   patient will require internal medicine hospitalist admission.  Call placed to Dr. Saintclair Halsted from neurosurgery but did not call back.  Patient has several month history of progressive cognitive decline more rapid over the past week and very significant in the last 2 days.  His sister states that she saw him 2 days ago when he seemed quite confused possibly with some facial droop even as early as yesterday.  Today he definitely had some facial droop earlier but was able to walk and go to the cafeteria but was not acting his normal self.  Patient not eating very well for a while patient subsequently was found with severe left-sided weakness and seemed to be quite confused.  Neuro hospitalist feel as if this was secondary to seizure.  Based on the CT finding of the neoplastic process with mass-effect patient received 10 mg of Decadron.  Patient was also loaded with Keppra and received 1 mg of Ativan.  Neurology will follow hospitalist will admit.  They want 6 mg of Decadron every  6 hours for the cerebral edema 1 Keppra 500 mg twice daily following the 1 g load.  Chest x-ray was ordered but not completed.  Instead patient had CT abdomen chest pelvis without any obvious findings for neoplastic process other than comes some concern for adrenal area.  An MRI may be required.    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

## 2017-09-23 NOTE — ED Triage Notes (Signed)
Facility called sister at 79 today, staff member states they thought he was acting ok last night. During lunch he wasn't eating. Pt went to the Dr, Staff members from Drs office called EMS due to left sided droop and weakness. EMS arrived, positive LVO. LKW unknown possibly within 24 hours (pt lives in independent living). Activated per Dr. Regenia Skeeter. Pt alert to self at arrival. L side facial droop and left side weakness noted. Pt not answering questions coherrently. 162/88, 90 NSR, CBG 113. SP02 97% room air.

## 2017-09-23 NOTE — ED Notes (Signed)
Patient transported to CT 

## 2017-09-23 NOTE — H&P (Signed)
History and Physical    Koltin Wehmeyer BZJ:[id number] DOB: Oct 25, 1940 DOA: 09/23/2017  Referring MD/NP/PA:   PCP: [name], MD   Patient coming from:  The patient is coming from independent living facility.  At baseline, pt is dependent for most of ADL.  Chief Complaint: AMS, left sided weakness  HPI: Gen Clagg is a 77 y.o. male with medical history significant of dementia, depression, anxiety, tobacco abuse, who presents with altered mental status, left-sided weakness.  Per his sister, pt's has dementia and he has been cognitively declining in the past several weeks. He becomes more and more confused in the past several days.  He was noted to have left-sided weakness, and left facial droop at about 2 PM yesterday. Per his sister, he had 2 episodes of facial twitching, lasting approximately 1 minute each.  Active nausea, vomiting, diarrhea, cough, shortness of breath, respiratory distress noted.  No urinary incontinence, or loss control for bowel movement.  ED Course: pt was found to have WBC 10.4, INR 1.012, PTT 34, creatinine 1.14, temperature normal, no tachycardia, slightly tachypnea, oxygen saturation 96% on room air.  Chest x-ray negative.  CT-head showed a 2.9 cm well-circumscribed heterogeneous mass in the right frontal lobe, associated with edema and mass effect with 4 mm right-to-left midline shift. Patient is admitted to stepdown as inpatient.  Dr. Leonel Ramsay of neurology and neurosurgeon were consulted by EDP.  Review of Systems: Could not be reviewed accurately due to dementia and altered mental status.  Allergy: No Known Allergies  Past Medical History:  Diagnosis Date  . Allergy   . Anxiety   . Cataract   . Dementia   . Depression   . Eczema   . Tobacco abuse     History reviewed. No pertinent surgical history.  Social History:  reports that he has been smoking.  He has been smoking about 0.50 packs per day. He does not have any smokeless tobacco history on  file. He reports that he does not drink alcohol or use drugs.  Family History:  Family History  Problem Relation Age of Onset  . Cancer Mother   . Heart disease Mother   . Cancer Father   . Stroke Father   . Cancer Sister   . Heart disease Brother   . Cancer Paternal Grandmother   . Cancer Paternal Grandfather      Prior to Admission medications   Medication Sig Start Date End Date Taking? Authorizing Provider  HYDROcodone-acetaminophen (NORCO/VICODIN) 5-325 MG per tablet Take 1-2 tablets by mouth every 4 (four) hours as needed for moderate pain or severe pain. Patient not taking: Reported on 05/25/2014 05/14/14   Charlesetta Shanks, MD  ibuprofen (ADVIL,MOTRIN) 600 MG tablet Take 1 tablet (600 mg total) by mouth every 6 (six) hours as needed. Patient not taking: Reported on 05/25/2014 05/14/14   Charlesetta Shanks, MD  silver sulfADIAZINE (SILVADENE) 1 % cream Apply 1 application topically daily. Patient not taking: Reported on 09/23/2017 05/14/14   Charlesetta Shanks, MD  silver sulfADIAZINE (SILVADENE) 1 % cream Apply 1 application topically daily. Patient not taking: Reported on 09/23/2017 05/25/14   Roselee Culver, MD    Physical Exam: Vitals:   09/23/17 2030 09/23/17 2200 09/23/17 2218 09/23/17 2255  BP: 138/72   127/70  Pulse: 80 79 75 73  Resp:  12 19 16   Temp:    98 F (36.7 C)  TempSrc:    Axillary  SpO2: 98% 97% 100% 100%  Weight:  71.8 kg (158 lb 4.6 oz)  Height:    5\' 9"  (1.753 m)   General: Not in acute distress HEENT:       Eyes: PERRL, EOMI, no scleral icterus.       ENT: No discharge from the ears and nose, no pharynx injection, no tonsillar enlargement.        Neck: No JVD, no bruit, no mass felt. Heme: No neck lymph node enlargement. Cardiac: S1/S2, RRR, No murmurs, No gallops or rubs. Respiratory: No rales, wheezing, rhonchi or rubs. GI: Soft, nondistended, nontender, no rebound pain, no organomegaly, BS present. GU: No hematuria Ext: No pitting leg edema  bilaterally. 2+DP/PT pulse bilaterally. Musculoskeletal: No joint deformities, No joint redness or warmth, no limitation of ROM in spin. Skin: No rashes.  Neuro: confused, knows his own name, but not oriented place and time. Cranial nerves II-XII grossly intact except for left facial droop. Muscle strength 1/5 in left arm and 3/5 in left leg. Brachial reflex 2+ bilaterally. Negative Babinski's sign.  Psych: Patient is not psychotic, no suicidal or hemocidal ideation.  Labs on Admission: I have personally reviewed following labs and imaging studies  CBC: Recent Labs  Lab 09/23/17 1714 09/23/17 1716  WBC  --  10.4  NEUTROABS  --  8.3*  HGB 14.6 13.9  HCT 43.0 41.7  MCV  --  92.9  PLT  --  622   Basic Metabolic Panel: Recent Labs  Lab 09/23/17 1714 09/23/17 1716  NA 139 139  K 4.7 4.8  CL 101 102  CO2  --  29  GLUCOSE 102* 103*  BUN 18 14  CREATININE 1.10 1.14  CALCIUM  --  9.4   GFR: Estimated Creatinine Clearance: 54.3 mL/min (by C-G formula based on SCr of 1.14 mg/dL). Liver Function Tests: Recent Labs  Lab 09/23/17 1716  AST 31  ALT 18  ALKPHOS 106  BILITOT 1.0  PROT 7.9  ALBUMIN 3.9   No results for input(s): LIPASE, AMYLASE in the last 168 hours. No results for input(s): AMMONIA in the last 168 hours. Coagulation Profile: Recent Labs  Lab 09/23/17 1716  INR 1.12   Cardiac Enzymes: No results for input(s): CKTOTAL, CKMB, CKMBINDEX, TROPONINI in the last 168 hours. BNP (last 3 results) No results for input(s): PROBNP in the last 8760 hours. HbA1C: No results for input(s): HGBA1C in the last 72 hours. CBG: Recent Labs  Lab 09/23/17 1739  GLUCAP 96   Lipid Profile: No results for input(s): CHOL, HDL, LDLCALC, TRIG, CHOLHDL, LDLDIRECT in the last 72 hours. Thyroid Function Tests: No results for input(s): TSH, T4TOTAL, FREET4, T3FREE, THYROIDAB in the last 72 hours. Anemia Panel: No results for input(s): VITAMINB12, FOLATE, FERRITIN, TIBC, IRON,  RETICCTPCT in the last 72 hours. Urine analysis: No results found for: COLORURINE, APPEARANCEUR, LABSPEC, PHURINE, GLUCOSEU, HGBUR, BILIRUBINUR, KETONESUR, PROTEINUR, UROBILINOGEN, NITRITE, LEUKOCYTESUR Sepsis Labs: @LABRCNTIP (procalcitonin:4,lacticidven:4) )No results found for this or any previous visit (from the past 240 hour(s)).   Radiological Exams on Admission: Ct Chest W Contrast  Result Date: 09/23/2017 CLINICAL DATA:  77 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 EXAM: CT CHEST, ABDOMEN, AND PELVIS WITH CONTRAST TECHNIQUE: Multidetector CT imaging of the chest, abdomen and pelvis was performed following the standard protocol during bolus administration of intravenous contrast. CONTRAST:  160mL OMNIPAQUE IOHEXOL 300 MG/ML  SOLN COMPARISON:  None. FINDINGS: CT CHEST FINDINGS Cardiovascular: There is no cardiomegaly or pericardial effusion. Advanced 3 vessel coronary vascular calcification. There is mild atherosclerotic calcification  of the thoracic aorta. No aneurysmal dilatation or evidence of dissection. The origins of the great vessels of the aortic arch appear patent as visualized. The central pulmonary arteries are grossly unremarkable. Mediastinum/Nodes: There is no hilar or mediastinal adenopathy. Esophagus is grossly unremarkable as visualized. There is a 1 cm calcified right thyroid nodule. Lungs/Pleura: Minimal bibasilar atelectatic changes noted. The lungs are otherwise clear. There is no pleural effusion or pneumothorax. The central airways are patent. Musculoskeletal: No chest wall mass or suspicious bone lesions identified. CT ABDOMEN PELVIS FINDINGS No intra-abdominal free air or free fluid. Hepatobiliary: The liver is unremarkable. No intrahepatic biliary ductal dilatation. Mild thickened appearance of the gallbladder wall may be related to incomplete distension or chronic inflammation. There are probable small stones within the gallbladder. No evidence of acute  cholecystitis by CT. Ultrasound may provide better evaluation of the gallbladder if clinically indicated. The common bile duct is mildly dilated measuring up to 12 mm in diameter. No calcified stone noted in the central CBD. Pancreas: There are scattered pancreatic calcifications, likely sequela of chronic pancreatitis. Correlation with pancreatic enzymes recommended to exclude acute pancreatitis. Spleen: Normal in size without focal abnormality. Adrenals/Urinary Tract: The right adrenal gland is unremarkable. There is a 2 cm indeterminate left adrenal nodule. MRI is recommended for further characterization. There is also thickening of the inferior aspect of the lateral limb of the left adrenal gland measuring up to 1.5 cm in thickness. Small left renal cysts. There is no hydronephrosis on either side. The right kidney is unremarkable. The visualized ureters and urinary bladder are unremarkable as well. Stomach/Bowel: There is no bowel obstruction or active inflammation. Normal appendix. Vascular/Lymphatic: There is advanced aortoiliac atherosclerotic disease. The splenic vein, SMV, and main portal vein are patent. No portal venous gas. There is no adenopathy. Reproductive: The prostate gland is enlarged measuring 5.7 cm in diameter. Other: None Musculoskeletal: Mild degenerative changes. No acute osseous pathology. IMPRESSION: 1. No acute intrathoracic, abdominal, or pelvic pathology. 2. Probable small gallstones. No definite evidence of acute cholecystitis by CT. Ultrasound may provide better evaluation of the gallbladder if clinically indicated. 3. Scattered pancreatic calcifications, likely sequela of chronic pancreatitis. Clinical correlation is recommended. 4. **An incidental finding of potential clinical significance has been found. Left adrenal nodules. Further characterization with MRI, adrenal protocol, on a nonemergent basis recommended.** Electronically Signed   By: Anner Crete M.D.   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 EXAM: CT CHEST, ABDOMEN, AND PELVIS WITH CONTRAST TECHNIQUE: Multidetector CT imaging of the chest, abdomen and pelvis was performed following the standard protocol during bolus administration of intravenous contrast. CONTRAST:  130mL OMNIPAQUE IOHEXOL 300 MG/ML  SOLN COMPARISON:  None. FINDINGS: CT CHEST FINDINGS Cardiovascular: There is no cardiomegaly or pericardial effusion. Advanced 3 vessel coronary vascular calcification. There is mild atherosclerotic calcification of the thoracic aorta. No aneurysmal dilatation or evidence of dissection. The origins of the great vessels of the aortic arch appear patent as visualized. The central pulmonary arteries are grossly unremarkable. Mediastinum/Nodes: There is no hilar or mediastinal adenopathy. Esophagus is grossly unremarkable as visualized. There is a 1 cm calcified right thyroid nodule. Lungs/Pleura: Minimal bibasilar atelectatic changes noted. The lungs are otherwise clear. There is no pleural effusion or pneumothorax. The central airways are patent. Musculoskeletal: No chest wall mass or suspicious bone lesions identified. CT ABDOMEN PELVIS FINDINGS No intra-abdominal free air  or free fluid. Hepatobiliary: The liver is unremarkable. No intrahepatic biliary ductal dilatation. Mild thickened appearance of the gallbladder wall may be related to incomplete distension or chronic inflammation. There are probable small stones within the gallbladder. No evidence of acute cholecystitis by CT. Ultrasound may provide better evaluation of the gallbladder if clinically indicated. The common bile duct is mildly dilated measuring up to 12 mm in diameter. No calcified stone noted in the central CBD. Pancreas: There are scattered pancreatic calcifications, likely sequela of chronic pancreatitis. Correlation with pancreatic enzymes  recommended to exclude acute pancreatitis. Spleen: Normal in size without focal abnormality. Adrenals/Urinary Tract: The right adrenal gland is unremarkable. There is a 2 cm indeterminate left adrenal nodule. MRI is recommended for further characterization. There is also thickening of the inferior aspect of the lateral limb of the left adrenal gland measuring up to 1.5 cm in thickness. Small left renal cysts. There is no hydronephrosis on either side. The right kidney is unremarkable. The visualized ureters and urinary bladder are unremarkable as well. Stomach/Bowel: There is no bowel obstruction or active inflammation. Normal appendix. Vascular/Lymphatic: There is advanced aortoiliac atherosclerotic disease. The splenic vein, SMV, and main portal vein are patent. No portal venous gas. There is no adenopathy. Reproductive: The prostate gland is enlarged measuring 5.7 cm in diameter. Other: None Musculoskeletal: Mild degenerative changes. No acute osseous pathology. IMPRESSION: 1. No acute intrathoracic, abdominal, or pelvic pathology. 2. Probable small gallstones. No definite evidence of acute cholecystitis by CT. Ultrasound may provide better evaluation of the gallbladder if clinically indicated. 3. Scattered pancreatic calcifications, likely sequela of chronic pancreatitis. Clinical correlation is recommended. 4. **An incidental finding of potential clinical significance has been found. Left adrenal nodules. Further characterization with MRI, adrenal protocol, on a nonemergent basis recommended.** Electronically Signed   By: Anner Crete M.D.   On: 09/23/2017 21:57   Dg Chest Port 1 View  Result Date: 09/23/2017 CLINICAL DATA:  Altered mental status EXAM: PORTABLE CHEST 1 VIEW COMPARISON:  CT brain 09/23/2017 FINDINGS: Carotid vascular calcification. No acute airspace disease or effusion. Normal cardiomediastinal silhouette. No pneumothorax. IMPRESSION: No active disease. Electronically Signed   By: Donavan Foil M.D.   On: 09/23/2017 20:26   Ct Head Code Stroke Wo Contrast  Result Date: 09/23/2017 CLINICAL DATA:  77 y/o M; left-sided weakness and left facial droop. EXAM: CT HEAD WITHOUT CONTRAST TECHNIQUE: Contiguous axial images were obtained from the base of the skull through the vertex without intravenous contrast. COMPARISON:  None. FINDINGS: Brain: Right frontal lobe well-circumscribed heterogeneous mass measuring 2.7 x 2.4 x 2.9 cm (AP x ML x CC series 3, image 22 and series 5, image 29). Extensive surrounding edema throughout the right frontal lobe and insula white matter. Associated mass effect with partial effacement of right lateral ventricle and 4 mm right-to-left midline shift. No herniation. No additional findings for stroke, hemorrhage, or focal mass effect identified. Mild chronic microvascular ischemic changes and parenchymal volume loss of the brain. Vascular: Calcific atherosclerosis of carotid siphons. No hyperdense vessel identified. Skull: Normal. Negative for fracture or focal lesion. Sinuses/Orbits: No acute finding. Other: Bilateral intra-ocular lens replacement. IMPRESSION: 1. 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 This can be further characterized with MRI with and without contrast. Associated edema and mass effect with 4 mm right-to-left midline shift. 2. No additional finding for stroke, hemorrhage, or focal mass effect. Electronically Signed   By: Kristine Garbe M.D.   On: 09/23/2017 17:34  EKG: Independently reviewed.  Sinus rhythm, bifascicular block   Assessment/Plan Principal Problem:   Brain mass Active Problems:   Dementia   Left-sided weakness   Tobacco abuse   Acute metabolic encephalopathy   Brain mass: pt's left sided weakness is likely due to brain mass. CT-head showed a 2.9 cm well-circumscribed heterogeneous mass in the right frontal lobe, associated with edema and mass effect with 4 mm  right-to-left midline shift. Not sure if pt has primary brain tumor or is due to metastasized to disease. Per his sister, pt's father had brain tumor. Dr. Leonel Ramsay of neurology was consulted, who recommended to look for primary source of tumor and seizure prevention. Neurosurgeon will be consulted by EDP.  -will admit to SDU as inpt -Seizure precaution -When necessary Ativan for seizure -Neurosurgeons recommendation-will f/u  -Frequent neuro check -highly Dr. Cecil Cobbs consultation and follow-up recommendations as follows:  1) CT chest abdomen pelvis with contrast  2) Keppra 500 mill grams twice daily following 1 g load  3) if no primary is found, he will need neurosurgical consult for consideration of biopsy  4) Decadron 6 mg every 6 for cerebral edema  5) EEG in the a.m.  6) neurology to follow  Dementia: no agitation. -Observe closely  Tobacco abuse: -nicotine patch.   DVT ppx: SQ Heparin     Code Status: DNR (I discussed with patient's sister who is his POA, and explained the meaning of CODE STATUS. Per his sister, patient would want to be DNR). Family Communication: Yes, patient's  sister  at bed side Disposition Plan:  Anticipate discharge back to previous home environment Consults called: Dr. Leonel Ramsay of neurology, and neurosurgery Admission status: Obs / tele  Inpatient/tele   medical floor/obs     SDU/inpation       Date of Service 09/24/2017    Ivor Costa [facility] Pager [phone]  If 7PM-7AM, please contact night-coverage [email or web address] Password [email or web address] 09/24/2017, 12:19 AM

## 2017-09-23 NOTE — ED Notes (Signed)
1936 pt more impulsive and ripping leads off. Seems more impulsive on assessment. See MAR for medications given prior to incident. Pt trying to sit up and get out of bed. EDP aware. Sister states pt has hx of dementia and sundowns sometimes. Pt tearful and told sister "just shoot me"

## 2017-09-24 ENCOUNTER — Inpatient Hospital Stay (HOSPITAL_COMMUNITY): Payer: Medicare HMO

## 2017-09-24 DIAGNOSIS — F0390 Unspecified dementia without behavioral disturbance: Secondary | ICD-10-CM

## 2017-09-24 DIAGNOSIS — Z720 Tobacco use: Secondary | ICD-10-CM

## 2017-09-24 DIAGNOSIS — G9341 Metabolic encephalopathy: Secondary | ICD-10-CM

## 2017-09-24 LAB — GLUCOSE, CAPILLARY: Glucose-Capillary: 132 mg/dL — ABNORMAL HIGH (ref 65–99)

## 2017-09-24 LAB — CBC
HEMATOCRIT: 42.5 % (ref 39.0–52.0)
HEMOGLOBIN: 14.3 g/dL (ref 13.0–17.0)
MCH: 31.2 pg (ref 26.0–34.0)
MCHC: 33.6 g/dL (ref 30.0–36.0)
MCV: 92.6 fL (ref 78.0–100.0)
Platelets: 387 10*3/uL (ref 150–400)
RBC: 4.59 MIL/uL (ref 4.22–5.81)
RDW: 12.4 % (ref 11.5–15.5)
WBC: 8 10*3/uL (ref 4.0–10.5)

## 2017-09-24 LAB — BASIC METABOLIC PANEL
Anion gap: 12 (ref 5–15)
BUN: 16 mg/dL (ref 6–20)
CALCIUM: 9.3 mg/dL (ref 8.9–10.3)
CO2: 23 mmol/L (ref 22–32)
Chloride: 102 mmol/L (ref 101–111)
Creatinine, Ser: 0.99 mg/dL (ref 0.61–1.24)
Glucose, Bld: 124 mg/dL — ABNORMAL HIGH (ref 65–99)
POTASSIUM: 4.2 mmol/L (ref 3.5–5.1)
Sodium: 137 mmol/L (ref 135–145)

## 2017-09-24 NOTE — Care Management Note (Signed)
Case Management Note  Patient Details  Name: Kailyn Dubie MRN: [id number] Date of Birth: 1941-03-18  Subjective/Objective:   History of dementia and tobacco use who presented from ILF with left-sided weakness and confusion beyond baseline; Admitted for Brain Mass and Acute encephalopathy on chronic dementia             Action/Plan: Neurosurgery, Dr. Christella Noa, consulted for consideration of biopsy/excision.  NCM will continue to follow patient progress.  Expected Discharge Date:  To Be determined               Expected Discharge Plan:  Uncertain. Suspect his current ongoing encephalopathy would require escalation of care from independent living. Pending further neuro and neurosurgical evaluations  In-House Referral:  Clinical Social Work  Discharge planning Services  CM Consult  Status of Service:  In process, will continue to follow  Kristen Cardinal, RN 09/24/2017, 10:56 AM

## 2017-09-24 NOTE — Progress Notes (Signed)
Bedside EEG completed, results pending. 

## 2017-09-24 NOTE — Progress Notes (Signed)
PROGRESS NOTE  Jonathon Keller  DXI:[id number] DOB: [birth year] DOA: 09/23/2017 PCP: [name], MD   Brief Narrative: Jonathon Keller is a 77 y.o. male with a history of dementia and tobacco use who presented from [facility] with left-sided weakness and confusion beyond baseline. His sister reports steady cognitive decline over the past few weeks, worse especially in past few days, and left-sided facial droop and a couple minute-long episodes of facial twitching prompted his visit to the ED 5/2. CT head showed a 2.9cm well-circumscribed heterogenous mass in right frontal lobe with edema and mass effect, 59mm R>L midline shift. Neurology was consulted, decadron started. For concern of seizures keppra was loaded and continued, EEG pending. CT chest, abdomen, and pelvis performed without primary source. Neurosurgery, Dr. Christella Noa, consulted for consideration of biopsy/excision.  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 Likely cause of progressive decline and current disinhibited features, possibly cause of weakness. No primary found on CT chest/abd/pelvis.  - Neurology following, and I've consulted Dr. Christella Noa who will evaluate the patient.  - Continue decadron 6mg  q6h - Keppra started, EEG pending. Seizure precautions.   Acute encephalopathy on chronic dementia:  - Mittens and soft restraints for patient safety. Minimize medications especially with EEG pending.  Tobacco use:  - Patch ordered.    DVT prophylaxis: Heparin Code Status: DNR confirmed on admission Family Communication: None at bedside this AM Disposition Plan: Uncertain. Suspect his current ongoing encephalopathy would require escalation of care from independent living. Pending further neuro and neurosurgical evaluations  Consultants:    Neurology, [facility]  Neurosurgery, [place]  Procedures:   None  Antimicrobials:  None   Subjective: Pt confused but denies any pain or weakness/numbness. Not oriented to place, time or situation.  Objective: Vitals:   09/23/17 2200 09/23/17 2218 09/23/17 2255 09/24/17 0115  BP:   127/70 121/72  Pulse: 79 75 73 69  Resp: 12 19 16 16   Temp:   98 F (36.7 C) 98 F (36.7 C)  TempSrc:   Axillary Axillary  SpO2: 97% 100% 100% 98%  Weight:   71.8 kg (158 lb 4.6 oz)   Height:   5\' 9"  (1.753 m)    No intake or output data in the 24 hours ending 09/24/17 1013 Filed Weights   09/23/17 2255  Weight: 71.8 kg (158 lb 4.6 oz)    Gen: 77 y.o. male in no distress Pulm: Non-labored breathing room air. Clear to auscultation bilaterally.  CV: Regular rate and rhythm. No murmur, rub, or gallop. No JVD, no pedal edema. GI: Abdomen soft, non-tender, non-distended, with normoactive bowel sounds. No organomegaly or masses felt. Ext: Warm, no deformities Skin: No rashes, lesions or ulcers Neuro: Alert, exhibiting impulsive behavior requiring very frequent redirection. Trying to leave bed moments after being told not to move. Not oriented to place, time or situation. Follows one-step commands intermittently and is not completely cooperative with exam. Left hand grip and shoulder abduction strength 1/5, LLE with 4/5 strength. Right sided strength is 5/5. Psych: Judgement and insight appear impaired by encephalopathy. Mood & affect appropriate.   Data Reviewed: I have personally reviewed following labs and imaging studies  CBC: Recent Labs  Lab 09/23/17 1714 09/23/17 1716 09/24/17 0544  WBC  --  10.4 8.0  NEUTROABS  --  8.3*  --  HGB 14.6 13.9 14.3  HCT 43.0 41.7 42.5  MCV  --  92.9 92.6  PLT  --  356 299   Basic Metabolic Panel: Recent Labs  Lab 09/23/17 1714 09/23/17 1716 09/24/17 0544  NA 139 139 137  K 4.7 4.8 4.2  CL 101 102 102  CO2  --  29 23  GLUCOSE 102* 103*  124*  BUN 18 14 16   CREATININE 1.10 1.14 0.99  CALCIUM  --  9.4 9.3   GFR: Estimated Creatinine Clearance: 62.5 mL/min (by C-G formula based on SCr of 0.99 mg/dL). Liver Function Tests: Recent Labs  Lab 09/23/17 1716  AST 31  ALT 18  ALKPHOS 106  BILITOT 1.0  PROT 7.9  ALBUMIN 3.9   No results for input(s): LIPASE, AMYLASE in the last 168 hours. No results for input(s): AMMONIA in the last 168 hours. Coagulation Profile: Recent Labs  Lab 09/23/17 1716  INR 1.12   Cardiac Enzymes: No results for input(s): CKTOTAL, CKMB, CKMBINDEX, TROPONINI in the last 168 hours. BNP (last 3 results) No results for input(s): PROBNP in the last 8760 hours. HbA1C: No results for input(s): HGBA1C in the last 72 hours. CBG: Recent Labs  Lab 09/23/17 1739  GLUCAP 96   Lipid Profile: No results for input(s): CHOL, HDL, LDLCALC, TRIG, CHOLHDL, LDLDIRECT in the last 72 hours. Thyroid Function Tests: No results for input(s): TSH, T4TOTAL, FREET4, T3FREE, THYROIDAB in the last 72 hours. Anemia Panel: No results for input(s): VITAMINB12, FOLATE, FERRITIN, TIBC, IRON, RETICCTPCT in the last 72 hours. Urine analysis: No results found for: COLORURINE, APPEARANCEUR, LABSPEC, PHURINE, GLUCOSEU, HGBUR, BILIRUBINUR, KETONESUR, PROTEINUR, UROBILINOGEN, NITRITE, LEUKOCYTESUR No results found for this or any previous visit (from the past 240 hour(s)).    Radiology Studies: Ct Chest W Contrast  Result Date: 09/23/2017 CLINICAL DATA:  77 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 EXAM: CT CHEST, ABDOMEN, AND PELVIS WITH CONTRAST TECHNIQUE: Multidetector CT imaging of the chest, abdomen and pelvis was performed following the standard protocol during bolus administration of intravenous contrast. CONTRAST:  138mL OMNIPAQUE IOHEXOL 300 MG/ML  SOLN COMPARISON:  None. FINDINGS: CT CHEST FINDINGS Cardiovascular: There is no cardiomegaly or pericardial effusion. Advanced 3 vessel coronary vascular  calcification. There is mild atherosclerotic calcification of the thoracic aorta. No aneurysmal dilatation or evidence of dissection. The origins of the great vessels of the aortic arch appear patent as visualized. The central pulmonary arteries are grossly unremarkable. Mediastinum/Nodes: There is no hilar or mediastinal adenopathy. Esophagus is grossly unremarkable as visualized. There is a 1 cm calcified right thyroid nodule. Lungs/Pleura: Minimal bibasilar atelectatic changes noted. The lungs are otherwise clear. There is no pleural effusion or pneumothorax. The central airways are patent. Musculoskeletal: No chest wall mass or suspicious bone lesions identified. CT ABDOMEN PELVIS FINDINGS No intra-abdominal free air or free fluid. Hepatobiliary: The liver is unremarkable. No intrahepatic biliary ductal dilatation. Mild thickened appearance of the gallbladder wall may be related to incomplete distension or chronic inflammation. There are probable small stones within the gallbladder. No evidence of acute cholecystitis by CT. Ultrasound may provide better evaluation of the gallbladder if clinically indicated. The common bile duct is mildly dilated measuring up to 12 mm in diameter. No calcified stone noted in the central CBD. Pancreas: There are scattered pancreatic calcifications, likely sequela of chronic pancreatitis. Correlation with pancreatic enzymes recommended to exclude acute pancreatitis. Spleen: Normal in size without focal abnormality. Adrenals/Urinary Tract: The right adrenal gland is unremarkable. There is a  2 cm indeterminate left adrenal nodule. MRI is recommended for further characterization. There is also thickening of the inferior aspect of the lateral limb of the left adrenal gland measuring up to 1.5 cm in thickness. Small left renal cysts. There is no hydronephrosis on either side. The right kidney is unremarkable. The visualized ureters and urinary bladder are unremarkable as well.  Stomach/Bowel: There is no bowel obstruction or active inflammation. Normal appendix. Vascular/Lymphatic: There is advanced aortoiliac atherosclerotic disease. The splenic vein, SMV, and main portal vein are patent. No portal venous gas. There is no adenopathy. Reproductive: The prostate gland is enlarged measuring 5.7 cm in diameter. Other: None Musculoskeletal: Mild degenerative changes. No acute osseous pathology. IMPRESSION: 1. No acute intrathoracic, abdominal, or pelvic pathology. 2. Probable small gallstones. No definite evidence of acute cholecystitis by CT. Ultrasound may provide better evaluation of the gallbladder if clinically indicated. 3. Scattered pancreatic calcifications, likely sequela of chronic pancreatitis. Clinical correlation is recommended. 4. **An incidental finding of potential clinical significance has been found. Left adrenal nodules. Further characterization with MRI, adrenal protocol, on a nonemergent basis recommended.** Electronically Signed   By: Anner Crete M.D.   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 EXAM: CT CHEST, ABDOMEN, AND PELVIS WITH CONTRAST TECHNIQUE: Multidetector CT imaging of the chest, abdomen and pelvis was performed following the standard protocol during bolus administration of intravenous contrast. CONTRAST:  158mL OMNIPAQUE IOHEXOL 300 MG/ML  SOLN COMPARISON:  None. FINDINGS: CT CHEST FINDINGS Cardiovascular: There is no cardiomegaly or pericardial effusion. Advanced 3 vessel coronary vascular calcification. There is mild atherosclerotic calcification of the thoracic aorta. No aneurysmal dilatation or evidence of dissection. The origins of the great vessels of the aortic arch appear patent as visualized. The central pulmonary arteries are grossly unremarkable. Mediastinum/Nodes: There is no hilar or mediastinal adenopathy. Esophagus is grossly  unremarkable as visualized. There is a 1 cm calcified right thyroid nodule. Lungs/Pleura: Minimal bibasilar atelectatic changes noted. The lungs are otherwise clear. There is no pleural effusion or pneumothorax. The central airways are patent. Musculoskeletal: No chest wall mass or suspicious bone lesions identified. CT ABDOMEN PELVIS FINDINGS No intra-abdominal free air or free fluid. Hepatobiliary: The liver is unremarkable. No intrahepatic biliary ductal dilatation. Mild thickened appearance of the gallbladder wall may be related to incomplete distension or chronic inflammation. There are probable small stones within the gallbladder. No evidence of acute cholecystitis by CT. Ultrasound may provide better evaluation of the gallbladder if clinically indicated. The common bile duct is mildly dilated measuring up to 12 mm in diameter. No calcified stone noted in the central CBD. Pancreas: There are scattered pancreatic calcifications, likely sequela of chronic pancreatitis. Correlation with pancreatic enzymes recommended to exclude acute pancreatitis. Spleen: Normal in size without focal abnormality. Adrenals/Urinary Tract: The right adrenal gland is unremarkable. There is a 2 cm indeterminate left adrenal nodule. MRI is recommended for further characterization. There is also thickening of the inferior aspect of the lateral limb of the left adrenal gland measuring up to 1.5 cm in thickness. Small left renal cysts. There is no hydronephrosis on either side. The right kidney is unremarkable. The visualized ureters and urinary bladder are unremarkable as well. Stomach/Bowel: There is no bowel obstruction or active inflammation. Normal appendix. Vascular/Lymphatic: There is advanced aortoiliac atherosclerotic disease. The splenic vein, SMV, and main portal vein are patent. No portal venous gas. There is  no adenopathy. Reproductive: The prostate gland is enlarged measuring 5.7 cm in diameter. Other: None Musculoskeletal:  Mild degenerative changes. No acute osseous pathology. IMPRESSION: 1. No acute intrathoracic, abdominal, or pelvic pathology. 2. Probable small gallstones. No definite evidence of acute cholecystitis by CT. Ultrasound may provide better evaluation of the gallbladder if clinically indicated. 3. Scattered pancreatic calcifications, likely sequela of chronic pancreatitis. Clinical correlation is recommended. 4. **An incidental finding of potential clinical significance has been found. Left adrenal nodules. Further characterization with MRI, adrenal protocol, on a nonemergent basis recommended.** Electronically Signed   By: Anner Crete M.D.   On: 09/23/2017 21:57   Dg Chest Port 1 View  Result Date: 09/23/2017 CLINICAL DATA:  Altered mental status EXAM: PORTABLE CHEST 1 VIEW COMPARISON:  CT brain 09/23/2017 FINDINGS: Carotid vascular calcification. No acute airspace disease or effusion. Normal cardiomediastinal silhouette. No pneumothorax. IMPRESSION: No active disease. Electronically Signed   By: Donavan Foil M.D.   On: 09/23/2017 20:26   Ct Head Code Stroke Wo Contrast  Result Date: 09/23/2017 CLINICAL DATA:  77 y/o M; left-sided weakness and left facial droop. EXAM: CT HEAD WITHOUT CONTRAST TECHNIQUE: Contiguous axial images were obtained from the base of the skull through the vertex without intravenous contrast. COMPARISON:  None. FINDINGS: Brain: Right frontal lobe well-circumscribed heterogeneous mass measuring 2.7 x 2.4 x 2.9 cm (AP x ML x CC series 3, image 22 and series 5, image 29). Extensive surrounding edema throughout the right frontal lobe and insula white matter. Associated mass effect with partial effacement of right lateral ventricle and 4 mm right-to-left midline shift. No herniation. No additional findings for stroke, hemorrhage, or focal mass effect identified. Mild chronic microvascular ischemic changes and parenchymal volume loss of the brain. Vascular: Calcific atherosclerosis of  carotid siphons. No hyperdense vessel identified. Skull: Normal. Negative for fracture or focal lesion. Sinuses/Orbits: No acute finding. Other: Bilateral intra-ocular lens replacement. IMPRESSION: 1. 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 This can be further characterized with MRI with and without contrast. Associated edema and mass effect with 4 mm right-to-left midline shift. 2. No additional finding for stroke, hemorrhage, or focal mass effect. Electronically Signed   By: Kristine Garbe M.D.   On: 09/23/2017 17:34    Scheduled Meds: . dexamethasone  6 mg Intravenous Q6H  . heparin  5,000 Units Subcutaneous Q8H  . nicotine  21 mg Transdermal Daily   Continuous Infusions: . sodium chloride 75 mL/hr at 09/23/17 2303  . levETIRAcetam 500 mg (09/24/17 0530)     LOS: 1 day   Time spent: 25 minutes.  Patrecia Pour, MD [facility] Pager [phone]  If 7PM-7AM, please contact night-coverage [email or web address] Password [email or web address] 09/24/2017, 10:13 AM

## 2017-09-24 NOTE — Evaluation (Signed)
Clinical/Bedside Swallow Evaluation Patient Details  Name: Jonathon Keller MRN: [id number] Date of Birth: [birth year]  Today's Date: 09/24/2017 Time: SLP Start Time (ACUTE ONLY): 1055 SLP Stop Time (ACUTE ONLY): 1110 SLP Time Calculation (min) (ACUTE ONLY): 15 min  Past Medical History:  Past Medical History:  Diagnosis Date  . Allergy   . Anxiety   . Cataract   . Dementia   . Depression   . Eczema   . Tobacco abuse    Past Surgical History: History reviewed. No pertinent surgical history. HPI:  77 yo male adm to [facility] with AMS, confusion and left sided weakness.  Pt CT chest showed bilateral ATX 09/23/17.  PMH + for anxiety, depression, tobacco use. 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  He is on seizure precautions and neuro surgery has been consulted.    Assessment / Plan / Recommendation Clinical Impression  Pt presents with facial and trigeminal nerve deficits impacting his swallow function.  In addition, suspect frontal lobe involvement impacts his self regulation and pt is impulsive - not following directions despite total multimodal cues.  Poor mastication ability with whole cracker retained in oral cavity with poor pt awareness.  Cues to clear oral residuals effective for approximately 10% of bolus, however pt had to remove dentures for adequate clearance.    No s/s of aspiration with water via straw, yogurt nor cracker bolus - intake was limited.  Pt did have left anterior labial spillage with cup bolus which was eliminated with use of straw.    Recommend full liquid diet (do not recommend pt use dentures due to ill fitting and pt's sensory-motor deficits) with full supervision as pt is impulsive.   SLP Visit Diagnosis: Dysphagia, oropharyngeal phase (R13.12)    Aspiration Risk  Moderate aspiration risk;Severe aspiration risk    Diet Recommendation Thin liquid;Other (Comment)(full liquid)   Liquid Administration via: Straw Medication Administration: Whole  meds with puree Supervision: Full supervision/cueing for compensatory strategies;Patient able to self feed Compensations: Minimize environmental distractions;Slow rate;Small sips/bites;Lingual sweep for clearance of pocketing(have pt feed himself but monitor amount/rate) Postural Changes: Seated upright at 90 degrees;Remain upright for at least 30 minutes after po intake    Other  Recommendations Oral Care Recommendations: Oral care QID   Follow up Recommendations    tbd    Frequency and Duration min 2x/week  2 weeks       Prognosis Prognosis for Safe Diet Advancement: Fair Barriers to Reach Goals: Cognitive deficits;Other (Comment)(medical dx)      Swallow Study   General Date of Onset: 09/24/17 HPI: 77 yo male adm to [facility] with AMS, confusion and left sided weakness.  Pt CT chest showed bilateral ATX 09/23/17.  PMH + for anxiety, depression, tobacco use. 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  He is on seizure precautions and neuro surgery has been consulted.  Type of Study: Bedside Swallow Evaluation Diet Prior to this Study: NPO Temperature Spikes Noted: No Respiratory Status: Room air History of Recent Intubation: No Behavior/Cognition: Alert;Doesn't follow directions;Other (Comment);Impulsive;Distractible Oral Cavity Assessment: Dry Oral Care Completed by SLP: Yes Oral Cavity - Dentition: Missing dentition;Dentures, top(slp cleaned upper denture but it was loose and no functional) Vision: Functional for self-feeding Self-Feeding Abilities: Able to feed self;Needs assist Patient Positioning: Upright in bed Baseline Vocal Quality: Low vocal intensity Volitional Cough: Cognitively unable to elicit Volitional Swallow: Unable to elicit    Oral/Motor/Sensory Function Overall Oral Motor/Sensory  Function: Moderate impairment(facial and trigeminal nerve impairments, pt did not follow directions) Facial ROM: Reduced left;Suspected CN VII (facial)  dysfunction Facial Symmetry: Abnormal symmetry left Facial Strength: Reduced left;Suspected CN VII (facial) dysfunction Facial Sensation: Reduced left;Suspected CN V (Trigeminal) dysfunction Velum: Within Functional Limits   Ice Chips Ice chips: Not tested   Thin Liquid Thin Liquid: Impaired Presentation: Self Fed;Straw;Cup Oral Phase Impairments: Reduced labial seal Oral Phase Functional Implications: Left anterior spillage    Nectar Thick Nectar Thick Liquid: Not tested   Honey Thick Honey Thick Liquid: Not tested   Puree Puree: Impaired Presentation: Self Fed;Spoon Oral Phase Impairments: Reduced labial seal Oral Phase Functional Implications: Left lateral sulci pocketing   Solid   GO   Solid: Impaired Presentation: Self Fed Oral Phase Impairments: Reduced labial seal;Impaired mastication Oral Phase Functional Implications: Left lateral sulci pocketing;Impaired mastication;Oral residue Other Comments: pt required total cues to clear residuals, needs full supervision due to impulsivity        Macario Golds 09/24/2017,11:31 AM  Luanna Salk, [facility] SLP [phone]

## 2017-09-24 NOTE — Progress Notes (Signed)
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  Medications: Dexamethasone Keppra  Technical Summary: A multichannel digital EEG recording measured by the international 10-20 system with electrodes applied with paste and impedances below 5000 ohms performed as portable with EKG monitoring in an awake patient.  Hyperventilation and photic stimulation were not performed.  The digital EEG was referentially recorded, reformatted, and digitally filtered in a variety of bipolar and referential montages for optimal display.   Description: The patient is awake during the recording.  During maximal wakefulness, there is an asymmetric, medium voltage 6 Hz posterior dominant rhythm. This is admixed with diffuse 4-5 Hz theta and mild 2-3 Hz delta slowing of the waking background.  There is also superimposed more pronounced 2-3 Hz delta slowing over the right hemisphere.  Stage 2 sleep is not seen.  There is abundant muscle and blink artifact.  During the recording, patient exhibited left facial twitching with no electrographic epileptiform correlate. There were no epileptiform discharges or electrographic seizures seen.    EKG lead was unremarkable.  Impression: This awake EEG is abnormal due to: 1.  Right hemispheric slowing 2.  diffuse slowing of the background.  Clinical Correlation of the above findings indicates: 1.  Structural or physiologic abnormality in the right hemisphere, consistent with known mass lesion 2.  diffuse cerebral dysfunction that is non-specific in etiology and can be seen with hypoxic/ischemic injury, toxic/metabolic encephalopathies, neurodegenerative disorders, or medication effect.   Metta Clines, DO

## 2017-09-24 NOTE — Progress Notes (Addendum)
Subjective: Patient is awake, he is now oriented and follows minimal commands.  Continues to tell me that he is up [place] when asked where he is.  Exam: Vitals:   09/23/17 2255 09/24/17 0115  BP: 127/70 121/72  Pulse: 73 69  Resp: 16 16  Temp: 98 F (36.7 C) 98 F (36.7 C)  SpO2: 100% 98%    Physical Exam   HEENT-  Normocephalic, no lesions, without obvious abnormality.  Normal external eye and conjunctiva.   Cardiovascular- S1-S2 audible, pulses palpable throughout   Lungs-no rhonchi or wheezing noted, no excessive working breathing.  Saturations within normal limits     Neuro:  Mental Status: Patient is alert, not oriented and when asked where he is states he is up [place].  He is able to name a pen but unable to follow commands such as show me his thumb.  When asked to raise his arms with visual help he raises his right arm and his right leg. Cranial Nerves: II: Intact bilaterally however he does neglect the left when given bilateral numbers of fingers to name III,IV, VI:  extra-ocular motions intact bilaterally pupils equal, round, reactive to light and accommodation V,VII: smile symmetric, facial light touch sensation normal bilaterally VIII: hearing normal bilaterally IX,X: uvula rises symmetrically XI: bilateral shoulder shrug XII: midline tongue extension Motor: Moving right arm and leg with 5/5 strength.  Able to lift left leg antigravity with 3/5 strength.  I could not get him to move his left arm. Sensory: Double simultaneous stimuli patient did tell me that I was touching bilateral sides, when touching the right he stated the right and when touching the left he stated touching the left.  However when asking him to raise both arms he is clearly neglecting his left side Deep Tendon Reflexes: 2+ and symmetric throughout Plantars: Right: downgoing   Left: downgoing Cerebellar: No clear ataxia on the right however I could not get him to follow commands to do such things  as finger-nose and heel-to-shin with his left     Medications:  Scheduled: . dexamethasone  6 mg Intravenous Q6H  . heparin  5,000 Units Subcutaneous Q8H  . nicotine  21 mg Transdermal Daily   Continuous: . sodium chloride 75 mL/hr at 09/23/17 2303  . levETIRAcetam 500 mg (09/24/17 0530)    Pertinent Labs/Diagnostics: EEG pending   Ct Chest W Contrast  Result Date: 09/23/2017 . IMPRESSION: 1. No acute intrathoracic, abdominal, or pelvic pathology. 2. Probable small gallstones. No definite evidence of acute cholecystitis by CT. Ultrasound may provide better evaluation of the gallbladder if clinically indicated. 3. Scattered pancreatic calcifications, likely sequela of chronic pancreatitis. Clinical correlation is recommended. 4. **An incidental finding of potential clinical significance has been found. Left adrenal nodules. Further characterization with MRI, adrenal protocol, on a nonemergent basis recommended.** Electronically Signed   By: Anner Crete M.D.   On: 09/23/2017 21:57   Dg Chest Port 1 View  Result Date: 09/23/2017 . IMPRESSION: No active disease. Electronically Signed   By: Donavan Foil M.D.   On: 09/23/2017 20:26   Ct Head Code Stroke Wo Contrast  Result Date: 09/23/2017 . IMPRESSION: 1. 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 This can be further characterized with MRI with and without contrast. Associated edema and mass effect with 4 mm right-to-left midline shift. 2. No additional finding for stroke, hemorrhage, or focal mass effect. Electronically Signed   By: Kristine Garbe M.D.   On:  09/23/2017 17:34     Etta Quill PA-C [facility] [phone]  Impression: L with right frontal brain tumor.  Further evaluation of CT chest abdomen pelvis did not show any primary source.  Further twitching noted while on Keppra.  Neurosurgery consult for consideration of biopsy pending.  EEG  pending.   Recommendations: --EEG this a.m. --MRI brain --Neurosurgical consult for possible biopsy --Continue Decadron   Roland Rack, MD [facility] [phone]  If 7pm- 7am, please page neurology on call as listed in [place].  09/24/2017, 9:54 AM

## 2017-09-25 ENCOUNTER — Inpatient Hospital Stay (HOSPITAL_COMMUNITY): Payer: Medicare HMO

## 2017-09-25 ENCOUNTER — Encounter (HOSPITAL_COMMUNITY): Payer: Self-pay | Admitting: Radiology

## 2017-09-25 MED ORDER — DIVALPROEX SODIUM 250 MG PO DR TAB
750.0000 mg | DELAYED_RELEASE_TABLET | Freq: Two times a day (BID) | ORAL | Status: DC
  Administered 2017-09-25 – 2017-09-30 (×10): 750 mg via ORAL
  Filled 2017-09-25 (×11): qty 3

## 2017-09-25 MED ORDER — LORAZEPAM 2 MG/ML IJ SOLN
1.0000 mg | Freq: Once | INTRAMUSCULAR | Status: DC | PRN
  Filled 2017-09-25: qty 1

## 2017-09-25 MED ORDER — VALPROATE SODIUM 500 MG/5ML IV SOLN
2000.0000 mg | Freq: Once | INTRAVENOUS | Status: AC
  Administered 2017-09-25: 2000 mg via INTRAVENOUS
  Filled 2017-09-25: qty 20

## 2017-09-25 MED ORDER — GADOBENATE DIMEGLUMINE 529 MG/ML IV SOLN
15.0000 mL | Freq: Once | INTRAVENOUS | Status: AC | PRN
  Administered 2017-09-25: 14 mL via INTRAVENOUS

## 2017-09-25 NOTE — Progress Notes (Signed)
  Speech Language Pathology Treatment: Dysphagia  Patient Details Name: Jonathon Keller MRN: [id number] DOB: 19-Jan-1941 Today's Date: 09/25/2017 Time: 4585-9292 SLP Time Calculation (min) (ACUTE ONLY): 14 min  Assessment / Plan / Recommendation Clinical Impres  Pt seen for follow-up for dysphagia; pt to head to MRI shortly. SLP provided diagnostic trials of thin liquids, purees. There is mild oral holding with solids>liquids; ? lingual pumping as pt making visible efforts to trigger swallow. He tolerates several bites of puree, straw sips of liquids without overt signs of aspiration. No pocketing or oral residue noted with solid. Minimal assist for rate control with straw. Recommend continuing full liquids, meds whole in puree but pt may also have bites of puree from floor stock as tolerated. Will follow up next date for advancement vs need for MBS to aid in determining safest, least restrictive diet.     HPI HPI: 77 yo male adm to [facility] with AMS, confusion and left sided weakness.  Pt CT chest showed bilateral ATX 09/23/17.  PMH + for anxiety, depression, tobacco use. 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  He is on seizure precautions and neuro surgery has been consulted.       SLP Plan  Continue with current plan of care       Recommendations  Diet recommendations: Dysphagia 1 (puree);Thin liquid Liquids provided via: Straw Medication Administration: Whole meds with puree Supervision: Full supervision/cueing for compensatory strategies Compensations: Minimize environmental distractions;Slow rate;Small sips/bites;Lingual sweep for clearance of pocketing                Oral Care Recommendations: Oral care BID Follow up Recommendations: Other (comment)(tba) SLP Visit Diagnosis: Dysphagia, oropharyngeal phase (R13.12) Plan: Continue with current plan of care       [place], [place], [place] Speech-Language Pathologist [place] 09/25/2017, 2:33 PM

## 2017-09-25 NOTE — Progress Notes (Signed)
Patient has become more alert and cooperative with care. While safety sitter is at the bedside patient heads verbal cues to not pull at lines and equipment. Soft wrist restraints were discontinued for patient's comfort. Patient did attempt to jump out of bed several times during passive range of motion, therefore ankle and soft belt restraint remained on.

## 2017-09-25 NOTE — Evaluation (Signed)
Physical Therapy Evaluation Patient Details Name: Jonathon Keller MRN: [id number] DOB: 07-Jun-1940 Today's Date: 09/25/2017   History of Present Illness  Pt is a 77 y/o male admitted secondary to L sided weakness and confusion beyond baseline. Pt found to have right frontal brain mass with mass effect/edema causing R > L 13mm midline shift. Neurosurg has been consulted (pending). PMH including but not limited to dementia and tobacco use.    Clinical Impression  Pt presented supine in bed with HOB elevated, awake and willing to participate in therapy session. Pt very restless and impulsive throughout. Pt with cognitive deficits at baseline with no family/caregivers present to provide any reliable information regarding pt's PLOF or home environment. Per chart review, pt is from an ILF. Pt currently able to perform bed mobility with modified independence, transfers with supervision and ambulated in hallway with min guard to min A for safety and stability. Pt very unsteady with LOB x4 requiring min A to recover. Based on pt's current presentation, he appears to be at an increased risk for falls and would greatly benefit from 24/7 supervision/assistance and ST rehab in SNF to maximize his independence with functional mobility prior to returning home (if able). Pt would continue to benefit from skilled physical therapy services at this time while admitted and after d/c to address the below listed limitations in order to improve overall safety and independence with functional mobility.     Follow Up Recommendations SNF;Supervision/Assistance - 24 hour    Equipment Recommendations  None recommended by PT    Recommendations for Other Services       Precautions / Restrictions Precautions Precautions: Fall Precaution Comments: 3-5 point restraints Restrictions Weight Bearing Restrictions: No      Mobility  Bed Mobility Overal bed mobility: Modified Independent                Transfers Overall  transfer level: Needs assistance Equipment used: None Transfers: Sit to/from Stand Sit to Stand: Supervision         General transfer comment: supervision for safety; pt impulsive  Ambulation/Gait Ambulation/Gait assistance: Min assist;Min guard Ambulation Distance (Feet): 75 Feet(75' x4) Assistive device: None Gait Pattern/deviations: Step-through pattern;Decreased stride length;Drifts right/left Gait velocity: WFL Gait velocity interpretation: 1.31 - 2.62 ft/sec, indicative of limited community ambulator General Gait Details: pt with modest instability requiring constant close min guard for safety; pt with LOB x4 requiring min A to maintain upright standing position (especially with horizontal head turns)  Science writer    Modified Rankin (Stroke Patients Only)       Balance Overall balance assessment: Needs assistance Sitting-balance support: Feet supported Sitting balance-Leahy Scale: Good     Standing balance support: During functional activity;No upper extremity supported Standing balance-Leahy Scale: Fair               High level balance activites: Head turns;Direction changes High Level Balance Comments: had LOB x4 requiring min A             Pertinent Vitals/Pain Pain Assessment: No/denies pain    Home Living Family/patient expects to be discharged to:: Unsure                 Additional Comments: pt with cognitive deficits at baseline with no family/caregivers present to provide any reliable information; per chart review, pt from ILF    Prior Function           Comments: Unsure -  pt with cognitive deficits at baseline with no family/caregivers present to provide any reliable information     Hand Dominance        Extremity/Trunk Assessment   Upper Extremity Assessment Upper Extremity Assessment: Overall WFL for tasks assessed    Lower Extremity Assessment Lower Extremity Assessment: Overall WFL  for tasks assessed;RLE deficits/detail;LLE deficits/detail RLE Coordination: decreased fine motor;decreased gross motor LLE Coordination: decreased gross motor;decreased fine motor       Communication   Communication: Expressive difficulties  Cognition Arousal/Alertness: Awake/alert Behavior During Therapy: Restless;Impulsive Overall Cognitive Status: No family/caregiver present to determine baseline cognitive functioning Area of Impairment: Orientation;Memory;Following commands;Safety/judgement;Problem solving                 Orientation Level: Disoriented to;Place;Time;Situation   Memory: Decreased short-term memory Following Commands: Follows one step commands with increased time;Follows one step commands consistently Safety/Judgement: Decreased awareness of safety;Decreased awareness of deficits   Problem Solving: Difficulty sequencing;Requires verbal cues        General Comments      Exercises     Assessment/Plan    PT Assessment Patient needs continued PT services  PT Problem List Decreased balance;Decreased mobility;Decreased coordination;Decreased cognition;Decreased knowledge of use of DME;Decreased safety awareness;Decreased knowledge of precautions       PT Treatment Interventions DME instruction;Gait training;Stair training;Functional mobility training;Therapeutic exercise;Neuromuscular re-education;Therapeutic activities;Balance training;Patient/family education    PT Goals (Current goals can be found in the Care Plan section)  Acute Rehab PT Goals Patient Stated Goal: unable to state PT Goal Formulation: Patient unable to participate in goal setting Time For Goal Achievement: 10/09/17 Potential to Achieve Goals: Good    Frequency Min 3X/week   Barriers to discharge        Co-evaluation               AM-PAC PT "6 Clicks" Daily Activity  Outcome Measure Difficulty turning over in bed (including adjusting bedclothes, sheets and  blankets)?: None Difficulty moving from lying on back to sitting on the side of the bed? : None Difficulty sitting down on and standing up from a chair with arms (e.g., wheelchair, bedside commode, etc,.)?: None Help needed moving to and from a bed to chair (including a wheelchair)?: A Little Help needed walking in hospital room?: A Little Help needed climbing 3-5 steps with a railing? : A Little 6 Click Score: 21    End of Session Equipment Utilized During Treatment: Gait belt Activity Tolerance: Patient tolerated treatment well Patient left: in bed;with call bell/phone within reach;with nursing/sitter in room;with restraints reapplied Nurse Communication: Mobility status PT Visit Diagnosis: Other abnormalities of gait and mobility (R26.89)    Time: 9379-0240 PT Time Calculation (min) (ACUTE ONLY): 29 min   Charges:   PT Evaluation $PT Eval Moderate Complexity: 1 Mod PT Treatments $Gait Training: 8-22 mins   PT G Codes:        [place], PT, DPT [place] 09/25/2017, 1:51 PM

## 2017-09-25 NOTE — Progress Notes (Signed)
Subjective: Appears slightly better today Exam: Vitals:   09/25/17 1243 09/25/17 1619  BP: 132/69 (!) 142/79  Pulse: 89 81  Resp: 20 20  Temp: 98.2 F (36.8 C) 97.9 F (36.6 C)  SpO2: 95% 99%   Gen: In bed, NAD Resp: non-labored breathing, no acute distress Abd: soft, nt  Neuro: MS: Awake, alert CN: He has a left facial droop with intermittent focal twitching, this was present during the EEG. 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   There is no clear metastatic source.  MRI with no other lesions to suggest metastatic disease.  He has had some problems with delirium, and given that Keppra can be associated with this I would favor changing him to Depakote.  The facial twitching is likely due to cortical irritability, but I would not aggressively treat as status epilepticus given the relative preservation of the rest of his exam.  Recommendations: 1) I will change Keppra to Depakote 750 mg twice daily following load 2) continue Decadron 3) neurology will continue to follow  Roland Rack, MD [facility] [phone]  If 7pm- 7am, please page neurology on call as listed in [place].

## 2017-09-25 NOTE — Progress Notes (Signed)
PROGRESS NOTE  Jonathon Keller  WCH:[id number] DOB: 10/11/1940 DOA: 09/23/2017 PCP: [name], MD   Brief Narrative: Jonathon Keller is a 77 y.o. male with a history of dementia and tobacco use who presented from ILF with left-sided weakness and confusion beyond baseline. His sister reports steady cognitive decline over the past few weeks, worse especially in past few days, and left-sided facial droop and a couple minute-long episodes of facial twitching prompted his visit to the ED 5/2. CT head showed a 2.9cm well-circumscribed heterogenous mass in right frontal lobe with edema and mass effect, 54mm R>L midline shift. Neurology was consulted, decadron started. For concern of seizures keppra was loaded and continued, EEG pending. CT chest, abdomen, and pelvis performed without primary source. Neurosurgery, Dr. Christella Noa, consulted for consideration of biopsy/excision.  Assessment & Plan: Principal Problem:   Brain mass Active Problems:   Dementia   Left-sided weakness   Tobacco abuse   Acute metabolic encephalopathy  Right frontal brain mass: With mass effect/edema causing R > L 63mm midline shift. Likely cause of progressive decline and current disinhibited features, possibly cause of weakness. No primary found on CT chest/abd/pelvis.  - Neurology following, will give ativan to aid with performance of MRI today.  - Neurosurgery, Dr. Christella Noa, was consulted 5/3.   - Continue decadron 6mg  q6h - Keppra started, EEG pending. Seizure precautions.   Acute encephalopathy on chronic dementia:  - Mittens and soft restraints for patient safety, would prefer to take these off and keep sitter at bedside.  - Minimize medications - Delirium precautions - Pt capable of participating in PT evaluation, ordered this AM.  Tobacco use:  - Patch ordered.    DVT prophylaxis: Heparin Code Status: DNR confirmed on admission Family Communication: None at bedside this AM Disposition Plan: Uncertain. Suspect his  current ongoing encephalopathy would require escalation of care from independent living. Pending further neuroimaging and neurosurgical evaluations.  Consultants:   Neurology, [facility]  Neurosurgery, [place]  Procedures:   None  Antimicrobials:  None   Subjective: Denies any problems, wants to walk. Not aware of his deficit.   Objective: Vitals:   09/24/17 2100 09/24/17 2340 09/25/17 0427 09/25/17 0900  BP: 114/67 136/66 (!) 141/76 131/68  Pulse: 85 70 83 87  Resp: 18 18 18 20   Temp: 98.5 F (36.9 C) 98.3 F (36.8 C)  98 F (36.7 C)  TempSrc: Oral Oral  Oral  SpO2:  97% 96% 97%  Weight:      Height:        Intake/Output Summary (Last 24 hours) at 09/25/2017 1230 Last data filed at 09/25/2017 0827 Gross per 24 hour  Intake 360 ml  Output -  Net 360 ml   Filed Weights   09/23/17 2255  Weight: 71.8 kg (158 lb 4.6 oz)    Gen: 77 y.o. male in no distress Pulm: Non-labored breathing room air. Clear to auscultation bilaterally.  CV: Regular rate and rhythm. No murmur, rub, or gallop. No JVD, no pedal edema. GI: Abdomen soft, non-tender, non-distended, with normoactive bowel sounds. No organomegaly or masses felt. Ext: Warm, no deformities. Soft wrist and ankle restraints without evidence of abrasion.  Skin: No rashes, lesions or ulcers Neuro: Alert, contineus to be impulsive. When asked where he is he delays and says "commercial." Not oriented to place, time or situation. Follows some but not all one-step commands and is not completely cooperative with exam. Left hand grip and shoulder abduction strength 1/5, LLE with 3/5 strength. Right sided  strength is 5/5. Psych: Judgement and insight appear impaired by encephalopathy. Mood & affect appropriate.   Data Reviewed: I have personally reviewed following labs and imaging studies  CBC: Recent Labs  Lab 09/23/17 1714 09/23/17 1716 09/24/17 0544  WBC  --  10.4 8.0  NEUTROABS  --  8.3*  --   HGB 14.6 13.9 14.3    HCT 43.0 41.7 42.5  MCV  --  92.9 92.6  PLT  --  356 841   Basic Metabolic Panel: Recent Labs  Lab 09/23/17 1714 09/23/17 1716 09/24/17 0544  NA 139 139 137  K 4.7 4.8 4.2  CL 101 102 102  CO2  --  29 23  GLUCOSE 102* 103* 124*  BUN 18 14 16   CREATININE 1.10 1.14 0.99  CALCIUM  --  9.4 9.3   GFR: Estimated Creatinine Clearance: 62.5 mL/min (by C-G formula based on SCr of 0.99 mg/dL). Liver Function Tests: Recent Labs  Lab 09/23/17 1716  AST 31  ALT 18  ALKPHOS 106  BILITOT 1.0  PROT 7.9  ALBUMIN 3.9   No results for input(s): LIPASE, AMYLASE in the last 168 hours. No results for input(s): AMMONIA in the last 168 hours. Coagulation Profile: Recent Labs  Lab 09/23/17 1716  INR 1.12   Cardiac Enzymes: No results for input(s): CKTOTAL, CKMB, CKMBINDEX, TROPONINI in the last 168 hours. BNP (last 3 results) No results for input(s): PROBNP in the last 8760 hours. HbA1C: No results for input(s): HGBA1C in the last 72 hours. CBG: Recent Labs  Lab 09/23/17 1739 09/24/17 1154  GLUCAP 96 132*   Lipid Profile: No results for input(s): CHOL, HDL, LDLCALC, TRIG, CHOLHDL, LDLDIRECT in the last 72 hours. Thyroid Function Tests: No results for input(s): TSH, T4TOTAL, FREET4, T3FREE, THYROIDAB in the last 72 hours. Anemia Panel: No results for input(s): VITAMINB12, FOLATE, FERRITIN, TIBC, IRON, RETICCTPCT in the last 72 hours. Urine analysis: No results found for: COLORURINE, APPEARANCEUR, LABSPEC, PHURINE, GLUCOSEU, HGBUR, BILIRUBINUR, KETONESUR, PROTEINUR, UROBILINOGEN, NITRITE, LEUKOCYTESUR No results found for this or any previous visit (from the past 240 hour(s)).    Radiology Studies: Ct Chest W Contrast  Result Date: 09/23/2017 CLINICAL DATA:  77 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 EXAM: CT CHEST, ABDOMEN, AND PELVIS WITH CONTRAST TECHNIQUE: Multidetector CT imaging of the chest, abdomen and pelvis was performed following the standard  protocol during bolus administration of intravenous contrast. CONTRAST:  13mL OMNIPAQUE IOHEXOL 300 MG/ML  SOLN COMPARISON:  None. FINDINGS: CT CHEST FINDINGS Cardiovascular: There is no cardiomegaly or pericardial effusion. Advanced 3 vessel coronary vascular calcification. There is mild atherosclerotic calcification of the thoracic aorta. No aneurysmal dilatation or evidence of dissection. The origins of the great vessels of the aortic arch appear patent as visualized. The central pulmonary arteries are grossly unremarkable. Mediastinum/Nodes: There is no hilar or mediastinal adenopathy. Esophagus is grossly unremarkable as visualized. There is a 1 cm calcified right thyroid nodule. Lungs/Pleura: Minimal bibasilar atelectatic changes noted. The lungs are otherwise clear. There is no pleural effusion or pneumothorax. The central airways are patent. Musculoskeletal: No chest wall mass or suspicious bone lesions identified. CT ABDOMEN PELVIS FINDINGS No intra-abdominal free air or free fluid. Hepatobiliary: The liver is unremarkable. No intrahepatic biliary ductal dilatation. Mild thickened appearance of the gallbladder wall may be related to incomplete distension or chronic inflammation. There are probable small stones within the gallbladder. No evidence of acute cholecystitis by CT. Ultrasound may provide better evaluation of the gallbladder if clinically  indicated. The common bile duct is mildly dilated measuring up to 12 mm in diameter. No calcified stone noted in the central CBD. Pancreas: There are scattered pancreatic calcifications, likely sequela of chronic pancreatitis. Correlation with pancreatic enzymes recommended to exclude acute pancreatitis. Spleen: Normal in size without focal abnormality. Adrenals/Urinary Tract: The right adrenal gland is unremarkable. There is a 2 cm indeterminate left adrenal nodule. MRI is recommended for further characterization. There is also thickening of the inferior aspect  of the lateral limb of the left adrenal gland measuring up to 1.5 cm in thickness. Small left renal cysts. There is no hydronephrosis on either side. The right kidney is unremarkable. The visualized ureters and urinary bladder are unremarkable as well. Stomach/Bowel: There is no bowel obstruction or active inflammation. Normal appendix. Vascular/Lymphatic: There is advanced aortoiliac atherosclerotic disease. The splenic vein, SMV, and main portal vein are patent. No portal venous gas. There is no adenopathy. Reproductive: The prostate gland is enlarged measuring 5.7 cm in diameter. Other: None Musculoskeletal: Mild degenerative changes. No acute osseous pathology. IMPRESSION: 1. No acute intrathoracic, abdominal, or pelvic pathology. 2. Probable small gallstones. No definite evidence of acute cholecystitis by CT. Ultrasound may provide better evaluation of the gallbladder if clinically indicated. 3. Scattered pancreatic calcifications, likely sequela of chronic pancreatitis. Clinical correlation is recommended. 4. **An incidental finding of potential clinical significance has been found. Left adrenal nodules. Further characterization with MRI, adrenal protocol, on a nonemergent basis recommended.** Electronically Signed   By: Anner Crete M.D.   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 EXAM: CT CHEST, ABDOMEN, AND PELVIS WITH CONTRAST TECHNIQUE: Multidetector CT imaging of the chest, abdomen and pelvis was performed following the standard protocol during bolus administration of intravenous contrast. CONTRAST:  148mL OMNIPAQUE IOHEXOL 300 MG/ML  SOLN COMPARISON:  None. FINDINGS: CT CHEST FINDINGS Cardiovascular: There is no cardiomegaly or pericardial effusion. Advanced 3 vessel coronary vascular calcification. There is mild atherosclerotic calcification of the thoracic aorta. No aneurysmal  dilatation or evidence of dissection. The origins of the great vessels of the aortic arch appear patent as visualized. The central pulmonary arteries are grossly unremarkable. Mediastinum/Nodes: There is no hilar or mediastinal adenopathy. Esophagus is grossly unremarkable as visualized. There is a 1 cm calcified right thyroid nodule. Lungs/Pleura: Minimal bibasilar atelectatic changes noted. The lungs are otherwise clear. There is no pleural effusion or pneumothorax. The central airways are patent. Musculoskeletal: No chest wall mass or suspicious bone lesions identified. CT ABDOMEN PELVIS FINDINGS No intra-abdominal free air or free fluid. Hepatobiliary: The liver is unremarkable. No intrahepatic biliary ductal dilatation. Mild thickened appearance of the gallbladder wall may be related to incomplete distension or chronic inflammation. There are probable small stones within the gallbladder. No evidence of acute cholecystitis by CT. Ultrasound may provide better evaluation of the gallbladder if clinically indicated. The common bile duct is mildly dilated measuring up to 12 mm in diameter. No calcified stone noted in the central CBD. Pancreas: There are scattered pancreatic calcifications, likely sequela of chronic pancreatitis. Correlation with pancreatic enzymes recommended to exclude acute pancreatitis. Spleen: Normal in size without focal abnormality. Adrenals/Urinary Tract: The right adrenal gland is unremarkable. There is a 2 cm indeterminate left adrenal nodule. MRI is recommended for further characterization. There is also thickening of the inferior aspect of the lateral limb of the left adrenal gland measuring up to 1.5 cm in  thickness. Small left renal cysts. There is no hydronephrosis on either side. The right kidney is unremarkable. The visualized ureters and urinary bladder are unremarkable as well. Stomach/Bowel: There is no bowel obstruction or active inflammation. Normal appendix. 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 The splenic vein, SMV, and main portal vein are patent. No portal venous gas. There is no adenopathy. Reproductive: The prostate gland is enlarged measuring 5.7 cm in diameter. Other: None Musculoskeletal: Mild degenerative changes. No acute osseous pathology. IMPRESSION: 1. No acute intrathoracic, abdominal, or pelvic pathology. 2. Probable small gallstones. No definite evidence of acute cholecystitis by CT. Ultrasound may provide better evaluation of the gallbladder if clinically indicated. 3. Scattered pancreatic calcifications, likely sequela of chronic pancreatitis. Clinical correlation is recommended. 4. **An incidental finding of potential clinical significance has been found. Left adrenal nodules. Further characterization with MRI, adrenal protocol, on a nonemergent basis recommended.** Electronically Signed   By: Anner Crete M.D.   On: 09/23/2017 21:57   Dg Chest Port 1 View  Result Date: 09/23/2017 CLINICAL DATA:  Altered mental status EXAM: PORTABLE CHEST 1 VIEW COMPARISON:  CT brain 09/23/2017 FINDINGS: Carotid vascular calcification. No acute airspace disease or effusion. Normal cardiomediastinal silhouette. No pneumothorax. IMPRESSION: No active disease. Electronically Signed   By: Donavan Foil M.D.   On: 09/23/2017 20:26   Ct Head Code Stroke Wo Contrast  Result Date: 09/23/2017 CLINICAL DATA:  77 y/o M; left-sided weakness and left facial droop. EXAM: CT HEAD WITHOUT CONTRAST TECHNIQUE: Contiguous axial images were obtained from the base of the skull through the vertex without intravenous contrast. COMPARISON:  None. FINDINGS: Brain: Right frontal lobe well-circumscribed heterogeneous mass measuring 2.7 x 2.4 x 2.9 cm (AP x ML x CC series 3, image 22 and series 5, image 29). Extensive surrounding edema throughout the right frontal lobe and insula white matter. Associated mass effect with partial effacement of right lateral ventricle  and 4 mm right-to-left midline shift. No herniation. No additional findings for stroke, hemorrhage, or focal mass effect identified. Mild chronic microvascular ischemic changes and parenchymal volume loss of the brain. Vascular: Calcific atherosclerosis of carotid siphons. No hyperdense vessel identified. Skull: Normal. Negative for fracture or focal lesion. Sinuses/Orbits: No acute finding. Other: Bilateral intra-ocular lens replacement. IMPRESSION: 1. 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 This can be further characterized with MRI with and without contrast. Associated edema and mass effect with 4 mm right-to-left midline shift. 2. No additional finding for stroke, hemorrhage, or focal mass effect. Electronically Signed   By: Kristine Garbe M.D.   On: 09/23/2017 17:34    Scheduled Meds: . dexamethasone  6 mg Intravenous Q6H  . heparin  5,000 Units Subcutaneous Q8H  . nicotine  21 mg Transdermal Daily   Continuous Infusions: . sodium chloride 75 mL/hr at 09/23/17 2303  . levETIRAcetam Stopped (09/25/17 0642)     LOS: 2 days   Time spent: 25 minutes.  Patrecia Pour, MD [facility] Pager [phone]  If 7PM-7AM, please contact night-coverage [email or web address] Password [email or web address] 09/25/2017, 12:30 PM

## 2017-09-26 ENCOUNTER — Inpatient Hospital Stay (HOSPITAL_COMMUNITY): Payer: Medicare HMO

## 2017-09-26 LAB — GLUCOSE, CAPILLARY: GLUCOSE-CAPILLARY: 113 mg/dL — AB (ref 65–99)

## 2017-09-26 LAB — VALPROIC ACID LEVEL: VALPROIC ACID LVL: 75 ug/mL (ref 50.0–100.0)

## 2017-09-26 MED ORDER — LEVETIRACETAM IN NACL 1000 MG/100ML IV SOLN
1000.0000 mg | Freq: Two times a day (BID) | INTRAVENOUS | Status: DC
  Administered 2017-09-26 – 2017-09-30 (×8): 1000 mg via INTRAVENOUS
  Filled 2017-09-26 (×10): qty 100

## 2017-09-26 MED ORDER — LEVETIRACETAM IN NACL 1500 MG/100ML IV SOLN
1500.0000 mg | Freq: Once | INTRAVENOUS | Status: AC
Start: 2017-09-26 — End: 2017-09-26
  Administered 2017-09-26: 1500 mg via INTRAVENOUS
  Filled 2017-09-26: qty 100

## 2017-09-26 MED ORDER — RESOURCE THICKENUP CLEAR PO POWD
ORAL | Status: DC | PRN
  Administered 2017-09-28: 14:00:00 via ORAL
  Filled 2017-09-26 (×2): qty 125

## 2017-09-26 NOTE — Progress Notes (Signed)
Modified Barium Swallow Progress Note  Patient Details  Name: Jonathon Keller MRN: [id number] Date of Birth: [birth year]  Today's Date: 09/26/2017  Modified Barium Swallow completed.  Full report located under Chart Review in the Imaging Section.  Brief recommendations include the following:  Clinical Impression  Pt presents with moderate sensorimotor dysphagia impacting both oral and pharyngeal stages of the swallow; pt aspirated both thin and nectar thick liquids. Oral stage characterized by oral holding, prolonged oral preparation, decreased bolus cohesion and premature spillage of liquids; mild-moderate oral residue on occasion. With solids, there is prolonged mastication and delayed oral transit. Pharyngeal stage is noted for decreased hyolaryngeal excursion resulting in reduced airway closure and vallecular (puree: moderate, soft solid: mod-severe, liquids: mild) and pyriform (thin, nectar: mild) residue. Pt does not follow commands for second swallow, dry spoon ineffective to cue. Penetration of thin via cup, which spilled into the open airway before the swallow with trace silent aspiration during the swallow. Pt does not follow cues for cough or compensatory manuevers. Thin residue in the pyriform sinuses was also penetrated during subsequent swallow of nectar thick liquid. Consistent, shallow penetration of nectar occurs due to delayed, incomplete closure of the laryngeal vestibule. Pt not responsive to cues to clear airway. With larger sip of nectar, there was apparent premature spillage with moderate aspiration not captured in the frame, however barium observed in the trachea and coating pt's vocal cords. Pt did have a weak, delayed cough response to this larger volume of aspiration. There was no penetration or aspiration with honey thick liquids, purees or soft solid. 13 mm barium tablet passed throught the cervical esophagus without difficulty. Pt impulsive, restless; exam was ended before  esophageal sweep as pt began removing his seatbelt and attempting to get up from the chair. Recommend dys 1, honey thick liquids with full supervision due to impulsivity. Meds can be given whole in puree. Will follow up for tolerance and pt/family education.   Swallow Evaluation Recommendations       SLP Diet Recommendations: Dysphagia 1 (Puree) solids;Honey thick liquids   Liquid Administration via: Cup   Medication Administration: Whole meds with puree   Supervision: Full supervision/cueing for compensatory strategies   Compensations: Minimize environmental distractions;Slow rate;Small sips/bites;Lingual sweep for clearance of pocketing;Follow solids with liquid   Postural Changes: Seated upright at 90 degrees   Oral Care Recommendations: Oral care BID   Other Recommendations: Order thickener from pharmacy;Prohibited food (jello, ice cream, thin soups);Remove water pitcher;Have oral suction available   [name], [name], CCC-SLP Speech-Language Pathologist [phone] [name] 09/26/2017,1:23 PM

## 2017-09-26 NOTE — Progress Notes (Signed)
PROGRESS NOTE  Jonathon Keller  ZOX:[id number] DOB: [birth year] DOA: 09/23/2017 PCP: [name], MD   Brief Narrative: Jonathon Keller is a 77 y.o. male with a history of dementia and tobacco use who presented from ILF with left-sided weakness and confusion beyond baseline. His sister reports steady cognitive decline over the past few weeks, worse especially in past few days, and left-sided facial droop and a couple minute-long episodes of facial twitching prompted his visit to the ED 5/2. CT head showed a 2.9cm well-circumscribed heterogenous mass in right frontal lobe with edema and mass effect, 69mm R>L midline shift. Neurology was consulted, decadron started. For concern of seizures keppra was loaded and continued, EEG pending. CT chest, abdomen, and pelvis performed without primary source. Neurosurgery, Dr. Christella Noa, consulted for consideration of biopsy/excision. The patient experienced some delirium so AED was changed (keppra to depakote). However, facial twitching continued so neurology has added back keppra and ordered repeat head CT.   Assessment & Plan: Principal Problem:   Brain mass Active Problems:   Dementia   Left-sided weakness   Tobacco abuse   Acute metabolic encephalopathy  Right frontal brain mass: With mass effect/edema causing R > L 38mm midline shift. Likely cause of progressive decline and current disinhibited features, possibly cause of weakness. No primary found on CT chest/abd/pelvis. MRI brain re-demonstrated the pass with vasogenic edema and stable midline shift. - Neurology following: Continue depakote and repeat keppra load and maintenance.  - Repeat CT head given worsening left hemineglect. - Neurosurgery, Dr. Christella Noa, was consulted 5/3, awaiting evaluation.   - Continue decadron 6mg  q6h - Continue with seizure precautions.  - PT/OT/SLP evaluations  Acute encephalopathy on chronic dementia: With element of acute delirium.  - Minimize restraints and medications as  much as possible. Sitter at bedside.  - Delirium precautions  Tobacco use:  - Patch ordered.    DVT prophylaxis: Heparin Code Status: DNR confirmed on admission Family Communication: None at bedside this AM Disposition Plan: Suspect his current ongoing encephalopathy would require escalation of care from independent living to SNF with 24hr supervision. Pending further neuroimaging and neurosurgical evaluations.  Consultants:   Neurology, Leonel Ramsay  Neurosurgery, [place]  Procedures:   None  Antimicrobials:  None   Subjective: Got himself into bedside chair today, remains impulsive but generally calm.   Objective: Vitals:   09/25/17 2017 09/26/17 0016 09/26/17 0416 09/26/17 0850  BP: (!) 139/91 121/75 135/73 (!) 141/75  Pulse: 91 74 86 96  Resp: 20 20 18 16   Temp: 98 F (36.7 C)  97.8 F (36.6 C) 97.9 F (36.6 C)  TempSrc: Oral  Oral Axillary  SpO2: 98%  97% 96%  Weight:      Height:        Intake/Output Summary (Last 24 hours) at 09/26/2017 1212 Last data filed at 09/25/2017 1700 Gross per 24 hour  Intake 3386.25 ml  Output -  Net 3386.25 ml   Filed Weights   09/23/17 2255  Weight: 71.8 kg (158 lb 4.6 oz)    Gen: 77 y.o. male in no distress Pulm: Non-labored breathing room air. Clear to auscultation bilaterally.  CV: Regular rate and rhythm. No murmur, rub, or gallop. No JVD, no pedal edema. GI: Abdomen soft, non-tender, non-distended, with normoactive bowel sounds. No organomegaly or masses felt. Ext: Warm, no deformities. Soft wrist and ankle restraints without evidence of abrasion.  Skin: No rashes, lesions or ulcers Neuro: Alert, not oriented. Following commands today but has left facial droop with no  twitching on my exam today. Left grip strength is 2/5, elbow/shoulder ~3/5. Left leg with 5/5. Right sided 5/5 throughout. Psych: Judgement and insight appear impaired by encephalopathy. Mood & affect appropriate.   Data Reviewed: I have personally  reviewed following labs and imaging studies  CBC: Recent Labs  Lab 09/23/17 1714 09/23/17 1716 09/24/17 0544  WBC  --  10.4 8.0  NEUTROABS  --  8.3*  --   HGB 14.6 13.9 14.3  HCT 43.0 41.7 42.5  MCV  --  92.9 92.6  PLT  --  356 630   Basic Metabolic Panel: Recent Labs  Lab 09/23/17 1714 09/23/17 1716 09/24/17 0544  NA 139 139 137  K 4.7 4.8 4.2  CL 101 102 102  CO2  --  29 23  GLUCOSE 102* 103* 124*  BUN 18 14 16   CREATININE 1.10 1.14 0.99  CALCIUM  --  9.4 9.3   GFR: Estimated Creatinine Clearance: 62.5 mL/min (by C-G formula based on SCr of 0.99 mg/dL). Liver Function Tests: Recent Labs  Lab 09/23/17 1716  AST 31  ALT 18  ALKPHOS 106  BILITOT 1.0  PROT 7.9  ALBUMIN 3.9   No results for input(s): LIPASE, AMYLASE in the last 168 hours. No results for input(s): AMMONIA in the last 168 hours. Coagulation Profile: Recent Labs  Lab 09/23/17 1716  INR 1.12   Cardiac Enzymes: No results for input(s): CKTOTAL, CKMB, CKMBINDEX, TROPONINI in the last 168 hours. BNP (last 3 results) No results for input(s): PROBNP in the last 8760 hours. HbA1C: No results for input(s): HGBA1C in the last 72 hours. CBG: Recent Labs  Lab 09/23/17 1739 09/24/17 1154 09/26/17 0628  GLUCAP 96 132* 113*   Lipid Profile: No results for input(s): CHOL, HDL, LDLCALC, TRIG, CHOLHDL, LDLDIRECT in the last 72 hours. Thyroid Function Tests: No results for input(s): TSH, T4TOTAL, FREET4, T3FREE, THYROIDAB in the last 72 hours. Anemia Panel: No results for input(s): VITAMINB12, FOLATE, FERRITIN, TIBC, IRON, RETICCTPCT in the last 72 hours. Urine analysis: No results found for: COLORURINE, APPEARANCEUR, LABSPEC, PHURINE, GLUCOSEU, HGBUR, BILIRUBINUR, KETONESUR, PROTEINUR, UROBILINOGEN, NITRITE, LEUKOCYTESUR No results found for this or any previous visit (from the past 240 hour(s)).    Radiology Studies: Mr [name] ZS Contrast  Result Date: 09/25/2017 CLINICAL DATA:  Initial  evaluation for intracranial mass. EXAM: MRI HEAD WITHOUT AND WITH CONTRAST TECHNIQUE: Multiplanar, multiecho pulse sequences of the brain and surrounding structures were obtained without and with intravenous contrast. CONTRAST:  63mL MULTIHANCE GADOBENATE DIMEGLUMINE 529 MG/ML IV SOLN COMPARISON:  Prior CT from 09/23/2017. FINDINGS: Brain: Heterogeneously and avidly enhancing mass positioned within the right frontal operculum measures 2.7 x 2.7 x 2.8 cm (series 20001, image 41). This corresponds with abnormality seen on prior CT. Scattered foci of internal susceptibility artifact consistent with necrosis and/or blood products. Surrounding vasogenic edema with regional mass effect within the adjacent cerebral hemisphere. Partial effacement of the right lateral ventricle with 4 mm of right-to-left shift. No hydrocephalus or ventricular trapping. Basilar cisterns remain widely patent. Scattered leptomeningeal enhancement within the adjacent right frontal lobe felt to be related to edema. Note made of an additional punctate nodular focus of apparent enhancement within the inferior left cerebellar hemisphere, faintly visible on axial post gad sequence (series 20001, image 11), but also visible on coronal sequence (series 21001, image 12) as well as sagittal sequence (series 22001, image 15). No definite T2/FLAIR correlate. Finding is nonspecific, but favored to be either artifactual or vascular in nature.  Attention at follow-up is warranted however. No other abnormal enhancement within the brain. No other mass lesion. No evidence for acute infarct. Underlying atrophy with mild chronic small vessel ischemic disease. No extra-axial fluid collection. Major dural sinuses patent. Pituitary gland suprasellar region normal. Midline structures intact. Vascular: Major intracranial vascular flow voids are maintained. Skull and upper cervical spine: Craniocervical junction normal. Upper cervical spine normal. Bone marrow signal  intensity within normal limits. No scalp soft tissue abnormality. Sinuses/Orbits: Globes and orbital soft tissues within normal limits. Patient status post lens extraction bilaterally. Left maxillary sinus retention cyst noted. Paranasal sinuses are otherwise clear. No mastoid effusion. Inner ear structures normal. Other: None. IMPRESSION: 1. 2.2 x 2.7 x 2.8 cm enhancing right frontal lobe mass, indeterminate. Primary differential considerations include high-grade glioma versus solitary intracranial metastasis. Associated vasogenic edema with regional mass effect with 4 mm of right-to-left shift. 2. Additional punctate nodular focus of enhancement within the inferior left cerebellar hemisphere, favored to be either artifactual and/or vascular in nature, although attention at follow-up is recommended. Electronically Signed   By: Jeannine Boga M.D.   On: 09/25/2017 16:50    Scheduled Meds: . dexamethasone  6 mg Intravenous Q6H  . divalproex  750 mg Oral Q12H  . heparin  5,000 Units Subcutaneous Q8H  . nicotine  21 mg Transdermal Daily   Continuous Infusions: . sodium chloride 75 mL/hr at 09/23/17 2303  . levETIRAcetam    . levETIRAcetam       LOS: 3 days   Time spent: 25 minutes.  Patrecia Pour, MD [facility] Pager [phone]  If 7PM-7AM, please contact night-coverage [email or web address] Password [email or web address] 09/26/2017, 12:12 PM

## 2017-09-26 NOTE — Consult Note (Signed)
Reason for Consult:brain tumor Referring Physician: blaike, vickers is an 77 y.o. male.  HPI: Jonathon Keller presented with acute mental status change and new left sided weakness. Head CT revealed a right frontal mass. Baseline he has dementia, depression, and anxiety. MRI confirms a solid mass right premotor area with surrounding edema. Report of facial twitching prior to admission.  Past Medical History:  Diagnosis Date  . Allergy   . Anxiety   . Cataract   . Dementia   . Depression   . Eczema   . Tobacco abuse     History reviewed. No pertinent surgical history.  Family History  Problem Relation Age of Onset  . Cancer Mother   . Heart disease Mother   . Cancer Father   . Stroke Father   . Cancer Sister   . Heart disease Brother   . Cancer Paternal Grandmother   . Cancer Paternal Grandfather     Social History:  reports that he has been smoking.  He has been smoking about 0.50 packs per day. He does not have any smokeless tobacco history on file. He reports that he does not drink alcohol or use drugs.  Allergies: No Known Allergies  Medications: I have reviewed the patient's current medications.  Results for orders placed or performed during the hospital encounter of 09/23/17 (from the past 48 hour(s))  Glucose, capillary     Status: Abnormal   Collection Time: 09/26/17  6:28 AM  Result Value Ref Range   Glucose-Capillary 113 (H) 65 - 99 mg/dL   Comment 1 Notify RN    Comment 2 Document in Chart   Valproic acid level     Status: None   Collection Time: 09/26/17  9:50 AM  Result Value Ref Range   Valproic Acid Lvl 75 50.0 - 100.0 ug/mL    Comment: Performed at [facility] [address] [place],  [address]    Mr Jonathon Keller WU Contrast  Result Date: 09/25/2017 CLINICAL DATA:  Initial evaluation for intracranial mass. EXAM: MRI HEAD WITHOUT AND WITH CONTRAST TECHNIQUE: Multiplanar, multiecho pulse sequences of the brain and surrounding structures  were obtained without and with intravenous contrast. CONTRAST:  60mL MULTIHANCE GADOBENATE DIMEGLUMINE 529 MG/ML IV SOLN COMPARISON:  Prior CT from 09/23/2017. FINDINGS: Brain: Heterogeneously and avidly enhancing mass positioned within the right frontal operculum measures 2.7 x 2.7 x 2.8 cm (series 20001, image 41). This corresponds with abnormality seen on prior CT. Scattered foci of internal susceptibility artifact consistent with necrosis and/or blood products. Surrounding vasogenic edema with regional mass effect within the adjacent cerebral hemisphere. Partial effacement of the right lateral ventricle with 4 mm of right-to-left shift. No hydrocephalus or ventricular trapping. Basilar cisterns remain widely patent. Scattered leptomeningeal enhancement within the adjacent right frontal lobe felt to be related to edema. Note made of an additional punctate nodular focus of apparent enhancement within the inferior left cerebellar hemisphere, faintly visible on axial post gad sequence (series 20001, image 11), but also visible on coronal sequence (series 21001, image 12) as well as sagittal sequence (series 22001, image 15). No definite T2/FLAIR correlate. Finding is nonspecific, but favored to be either artifactual or vascular in nature. Attention at follow-up is warranted however. No other abnormal enhancement within the brain. No other mass lesion. No evidence for acute infarct. Underlying atrophy with mild chronic small vessel ischemic disease. No extra-axial fluid collection. Major dural sinuses patent. Pituitary gland suprasellar region normal. Midline structures intact. Vascular:  Major intracranial vascular flow voids are maintained. Skull and upper cervical spine: Craniocervical junction normal. Upper cervical spine normal. Bone marrow signal intensity within normal limits. No scalp soft tissue abnormality. Sinuses/Orbits: Globes and orbital soft tissues within normal limits. Patient status post lens  extraction bilaterally. Left maxillary sinus retention cyst noted. Paranasal sinuses are otherwise clear. No mastoid effusion. Inner ear structures normal. Other: None. IMPRESSION: 1. 2.2 x 2.7 x 2.8 cm enhancing right frontal lobe mass, indeterminate. Primary differential considerations include high-grade glioma versus solitary intracranial metastasis. Associated vasogenic edema with regional mass effect with 4 mm of right-to-left shift. 2. Additional punctate nodular focus of enhancement within the inferior left cerebellar hemisphere, favored to be either artifactual and/or vascular in nature, although attention at follow-up is recommended. Electronically Signed   By: Jeannine Boga M.D.   On: 09/25/2017 16:50   Dg Swallowing Func-speech Pathology  Result Date: 09/26/2017 Objective Swallowing Evaluation: Type of Study: MBS-Modified Barium Swallow Study  Patient Details Name: Jonathon Keller MRN: [id number] Date of Birth: [birth year] Today's Date: 09/26/2017 Time: SLP Start Time (ACUTE ONLY): 1220 -SLP Stop Time (ACUTE ONLY): 1245 SLP Time Calculation (min) (ACUTE ONLY): 25 min Past Medical History: Past Medical History: Diagnosis Date . Allergy  . Anxiety  . Cataract  . Dementia  . Depression  . Eczema  . Tobacco abuse  Past Surgical History: No past surgical history on file. HPI: 77 yo male adm to [facility] with AMS, confusion and left sided weakness.  Pt CT chest showed bilateral ATX 09/23/17.  PMH + for anxiety, depression, tobacco use. 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  He is on seizure precautions and neuro surgery has been consulted.  Subjective: pt removing blankets, towel Assessment / Plan / Recommendation CHL IP CLINICAL IMPRESSIONS 09/26/2017 Clinical Impression Pt presents with moderate sensorimotor dysphagia impacting both oral and pharyngeal stages of the swallow; pt aspirated both thin and nectar thick liquids. Oral stage characterized by oral holding, prolonged oral preparation,  decreased bolus cohesion and premature spillage of liquids; mild-moderate oral residue on occasion. With solids, there is prolonged mastication and delayed oral transit. Pharyngeal stage is noted for decreased hyolaryngeal excursion resulting in reduced airway closure and vallecular (puree: moderate, soft solid: mod-severe, liquids: mild) and pyriform (thin, nectar: mild) residue. Pt does not follow commands for second swallow, dry spoon ineffective to cue. Penetration of thin via cup, which spilled into the open airway before the swallow with trace silent aspiration during the swallow. Pt does not follow cues for cough or compensatory manuevers. Thin residue in the pyriform sinuses was also penetrated during subsequent swallow of nectar thick liquid. Consistent, shallow penetration of nectar occurs due to delayed, incomplete closure of the laryngeal vestibule. Pt not responsive to cues to clear airway. With larger sip of nectar, there was apparent premature spillage with moderate aspiration not captured in the frame, however barium observed in the trachea and coating pt's vocal cords. Pt did have a weak, delayed cough response to this larger volume of aspiration. There was no penetration or aspiration with honey thick liquids, purees or soft solid. 13 mm barium tablet passed throught the cervical esophagus without difficulty. Pt impulsive, restless; exam was ended before esophageal sweep as pt began removing his seatbelt and attempting to get up from the chair. Recommend dys 1, honey thick liquids with full supervision due to impulsivity. Meds can be given whole in puree. Will follow up for tolerance and pt/family education. SLP  Visit Diagnosis Dysphagia, oropharyngeal phase (R13.12) Attention and concentration deficit following -- Frontal lobe and executive function deficit following -- Impact on safety and function Moderate aspiration risk   CHL IP TREATMENT RECOMMENDATION 09/26/2017 Treatment Recommendations  Therapy as outlined in treatment plan below   Prognosis 09/26/2017 Prognosis for Safe Diet Advancement Fair Barriers to Reach Goals Cognitive deficits;Other (Comment) Barriers/Prognosis Comment -- CHL IP DIET RECOMMENDATION 09/26/2017 SLP Diet Recommendations Dysphagia 1 (Puree) solids;Honey thick liquids Liquid Administration via Cup Medication Administration Whole meds with puree Compensations Minimize environmental distractions;Slow rate;Small sips/bites;Lingual sweep for clearance of pocketing;Follow solids with liquid Postural Changes Seated upright at 90 degrees   CHL IP OTHER RECOMMENDATIONS 09/26/2017 Recommended Consults -- Oral Care Recommendations Oral care BID Other Recommendations Order thickener from pharmacy;Prohibited food (jello, ice cream, thin soups);Remove water pitcher;Have oral suction available   CHL IP FOLLOW UP RECOMMENDATIONS 09/26/2017 Follow up Recommendations Other (comment)   CHL IP FREQUENCY AND DURATION 09/26/2017 Speech Therapy Frequency (ACUTE ONLY) min 2x/week Treatment Duration 2 weeks      CHL IP ORAL PHASE 09/26/2017 Oral Phase Impaired Oral - Pudding Teaspoon -- Oral - Pudding Cup -- Oral - Honey Teaspoon -- Oral - Honey Cup -- Oral - Nectar Teaspoon -- Oral - Nectar Cup -- Oral - Nectar Straw -- Oral - Thin Teaspoon Left anterior bolus loss;Holding of bolus;Delayed oral transit;Decreased bolus cohesion;Premature spillage Oral - Thin Cup Left anterior bolus loss;Holding of bolus;Delayed oral transit;Decreased bolus cohesion;Premature spillage;Lingual/palatal residue Oral - Thin Straw -- Oral - Puree Reduced posterior propulsion;Holding of bolus;Delayed oral transit;Decreased bolus cohesion;Lingual/palatal residue Oral - Mech Soft Impaired mastication;Reduced posterior propulsion;Holding of bolus;Lingual/palatal residue;Delayed oral transit;Decreased bolus cohesion Oral - Regular -- Oral - Multi-Consistency -- Oral - Pill Delayed oral transit Oral Phase - Comment --  CHL IP PHARYNGEAL PHASE  09/26/2017 Pharyngeal Phase Impaired Pharyngeal- Pudding Teaspoon -- Pharyngeal -- Pharyngeal- Pudding Cup -- Pharyngeal -- Pharyngeal- Honey Teaspoon -- Pharyngeal -- Pharyngeal- Honey Cup Delayed swallow initiation-vallecula;Reduced laryngeal elevation;Pharyngeal residue - valleculae Pharyngeal -- Pharyngeal- Nectar Teaspoon Delayed swallow initiation-pyriform sinuses;Reduced epiglottic inversion;Reduced laryngeal elevation;Reduced anterior laryngeal mobility;Reduced airway/laryngeal closure;Penetration/Aspiration during swallow;Pharyngeal residue - valleculae;Pharyngeal residue - pyriform Pharyngeal Material enters airway, remains ABOVE vocal cords and not ejected out Pharyngeal- Nectar Cup Delayed swallow initiation-pyriform sinuses;Reduced epiglottic inversion;Reduced anterior laryngeal mobility;Reduced laryngeal elevation;Reduced airway/laryngeal closure;Penetration/Aspiration before swallow;Penetration/Aspiration during swallow;Moderate aspiration;Pharyngeal residue - valleculae;Pharyngeal residue - pyriform Pharyngeal Material enters airway, remains ABOVE vocal cords and not ejected out;Material enters airway, passes BELOW cords and not ejected out despite cough attempt by patient Pharyngeal- Nectar Straw -- Pharyngeal -- Pharyngeal- Thin Teaspoon Delayed swallow initiation-vallecula;Reduced anterior laryngeal mobility;Reduced laryngeal elevation;Reduced airway/laryngeal closure;Pharyngeal residue - valleculae;Pharyngeal residue - pyriform Pharyngeal -- Pharyngeal- Thin Cup Delayed swallow initiation-pyriform sinuses;Reduced epiglottic inversion;Reduced anterior laryngeal mobility;Reduced laryngeal elevation;Reduced airway/laryngeal closure;Penetration/Aspiration before swallow;Penetration/Aspiration during swallow;Penetration/Apiration after swallow;Pharyngeal residue - valleculae;Pharyngeal residue - pyriform;Trace aspiration Pharyngeal Material enters airway, passes BELOW cords without attempt by patient to  eject out (silent aspiration);Material enters airway, remains ABOVE vocal cords and not ejected out Pharyngeal- Thin Straw -- Pharyngeal -- Pharyngeal- Puree Delayed swallow initiation-vallecula;Reduced anterior laryngeal mobility;Reduced laryngeal elevation;Pharyngeal residue - valleculae Pharyngeal -- Pharyngeal- Mechanical Soft Delayed swallow initiation-vallecula;Reduced laryngeal elevation;Reduced anterior laryngeal mobility;Pharyngeal residue - valleculae Pharyngeal -- Pharyngeal- Regular -- Pharyngeal -- Pharyngeal- Multi-consistency -- Pharyngeal -- Pharyngeal- Pill Delayed swallow initiation-vallecula Pharyngeal -- Pharyngeal Comment --  CHL IP CERVICAL ESOPHAGEAL PHASE 09/26/2017 Cervical Esophageal Phase WFL Pudding Teaspoon -- Pudding Cup -- Honey Teaspoon -- Honey Cup -- [employer]  Teaspoon -- Nectar Cup -- Nectar Straw -- Thin Teaspoon -- Thin Cup -- Thin Straw -- Puree -- Mechanical Soft -- Regular -- Multi-consistency -- Pill -- Cervical Esophageal Comment -- [name], MS, CCC-SLP Speech-Language Pathologist [phone] No flowsheet data found. [name] 09/26/2017, 1:23 PM               ROS Blood pressure 139/75, pulse 79, temperature 97.7 F (36.5 C), temperature source Oral, resp. rate 20, height 5\' 9"  (1.753 m), weight 71.8 kg (158 lb 4.6 oz), SpO2 93 %. Physical Exam  Constitutional: He appears well-developed and well-nourished. No distress.  HENT:  Head: Normocephalic and atraumatic.  Right Ear: External ear normal.  Left Ear: External ear normal.  Eyes: Pupils are equal, round, and reactive to light. Conjunctivae and EOM are normal.  Neck: Normal range of motion.  Cardiovascular: Normal rate and regular rhythm.  Respiratory: Effort normal and breath sounds normal.  GI: Soft. Bowel sounds are normal.  Musculoskeletal: Normal range of motion.  Neurological: He is alert.  Left facial droop Coordination not assessed Plegic on left side but moves all extremities Not  cooperative enough for detailed exam    Assessment/Plan: Mr. Stcharles has a solid enhancing mass in the right frontal lobe. I have recommended to his sister whom has power of attorney that the tumor should be removed. Risks including but not limited to stroke, coma, death, bleeding, infection, inability to resect the mass, personality changes, weakness on left side, and other risks were discussed. She understood and signed consent for a planned craniotomy to resect the mass.   Khian Remo L 09/26/2017, 3:03 PM

## 2017-09-26 NOTE — Progress Notes (Signed)
Subjective: More encephalopathic today, facial twitching is more prominent.   Exam: Vitals:   09/26/17 0416 09/26/17 0850  BP: 135/73 (!) 141/75  Pulse: 86 96  Resp: 18 16  Temp: 97.8 F (36.6 C) 97.9 F (36.6 C)  SpO2: 97% 96%   Gen: In bed, NAD Resp: non-labored breathing, no acute distress Abd: soft, nt  Neuro: MS: Awake, alert CN: He has a left facial droop with intermittent focal twitching. He does not identify finger movement in the left hemifield.  Motor: 5/5 strength on the right, he has 4/5 strength of the left arm today, He still has  Good strenght in the left leg, but is clearly neglectin git.  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   There is no clear metastatic source.  MRI with no other lesions to suggest metastatic disease.  He has had some problems with delirium, and given that Keppra can be associated with this I would favor changing him to Depakote.  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 His exam is slightly worse today, and therefore I will add back keppra and repeat imaging.   Recommendations: 1) Depakote 750 mg twice daily, level today 2) head CT 3) Keppra 1g bid following 1500mg  load 4) neurology will continue to follow  Roland Rack, MD [facility] [phone]  If 7pm- 7am, please page neurology on call as listed in [place].

## 2017-09-26 NOTE — NC FL2 (Signed)
[place] LEVEL OF CARE SCREENING TOOL     IDENTIFICATION  Patient Name: Jonathon Keller Birthdate: 12-29-1940 Sex: male Admission Date (Current Location): 09/23/2017  [facility] and [place] Number:  Herbalist and Address:  [facility] [place], [place] [address] [place], [place] [address]      Provider Number: [id number]  Attending Physician Name and Address:  Patrecia Pour, MD  Relative Name and Phone Number:       Current Level of Care: Hospital Recommended Level of Care: [place] Prior Approval Number:    Date Approved/Denied:   PASRR Number: Manual review  Discharge Plan: SNF    Current Diagnoses: Patient Active Problem List   Diagnosis Date Noted  . Dementia 09/23/2017  . Left-sided weakness 09/23/2017  . Brain mass 09/23/2017  . Tobacco abuse   . Eczema   . Acute metabolic encephalopathy     Orientation RESPIRATION BLADDER Height & Weight     (not oriented)  Normal Incontinent Weight: 158 lb 4.6 oz (71.8 kg) Height:  5\' 9"  (175.3 cm)  BEHAVIORAL SYMPTOMS/MOOD NEUROLOGICAL BOWEL NUTRITION STATUS    Convulsions/Seizures Incontinent Diet(see DC Summary)  AMBULATORY STATUS COMMUNICATION OF NEEDS Skin   Extensive Assist Verbally Normal                       Personal Care Assistance Level of Assistance  Bathing, Feeding, Dressing Bathing Assistance: Maximum assistance Feeding assistance: Maximum assistance Dressing Assistance: Maximum assistance     Functional Limitations Info  Sight, Hearing, Speech Sight Info: Adequate Hearing Info: Adequate Speech Info: Impaired(dysarthric)    SPECIAL CARE FACTORS FREQUENCY  PT (By licensed PT), OT (By licensed OT), Speech therapy     PT Frequency: 5x/wk OT Frequency: 5x/wk     Speech Therapy Frequency: 5x/wk      Contractures Contractures Info: Not present    Additional Factors Info  Code Status, Allergies Code Status Info: DNR Allergies Info: NKA           Current Medications (09/26/2017):  This is the current hospital active medication list Current Facility-Administered Medications  Medication Dose Route Frequency Provider Last Rate Last Dose  . 0.9 %  sodium chloride infusion   Intravenous Continuous Ivor Costa, MD 75 mL/hr at 09/23/17 2303    . acetaminophen (TYLENOL) tablet 650 mg  650 mg Oral Q6H PRN Ivor Costa, MD       Or  . acetaminophen (TYLENOL) suppository 650 mg  650 mg Rectal Q6H PRN Ivor Costa, MD      . dexamethasone (DECADRON) injection 6 mg  6 mg Intravenous Q6H Ivor Costa, MD   6 mg at 09/26/17 0533  . divalproex (DEPAKOTE) DR tablet 750 mg  750 mg Oral Q12H Greta Doom, MD   750 mg at 09/26/17 0859  . heparin injection 5,000 Units  5,000 Units Subcutaneous Q8H Ivor Costa, MD   5,000 Units at 09/26/17 0533  . levETIRAcetam (KEPPRA) IVPB 1000 mg/100 mL premix  1,000 mg Intravenous Q12H Greta Doom, MD      . levETIRAcetam (KEPPRA) IVPB 1500 mg/ 100 mL premix  1,500 mg Intravenous Once Greta Doom, MD      . LORazepam (ATIVAN) injection 1 mg  1 mg Intravenous Q2H PRN Ivor Costa, MD   1 mg at 09/25/17 1413  . LORazepam (ATIVAN) injection 1 mg  1 mg Intravenous Once PRN Patrecia Pour, MD      .  nicotine (NICODERM CQ - dosed in mg/24 hours) patch 21 mg  21 mg Transdermal Daily Ivor Costa, MD   21 mg at 09/26/17 0900  . ondansetron (ZOFRAN) tablet 4 mg  4 mg Oral Q6H PRN Ivor Costa, MD       Or  . ondansetron [facility]) injection 4 mg  4 mg Intravenous Q6H PRN Ivor Costa, MD         Discharge Medications: Please see discharge summary for a list of discharge medications.  Relevant Imaging Results:  Relevant Lab Results:   Additional Information SS#: [id number]  Geralynn Ochs, LCSW

## 2017-09-26 NOTE — Progress Notes (Signed)
SLP Cancellation Note  Patient Details Name: Bryne Lindon MRN: [id number] DOB: [birth year]   Cancelled treatment:       Reason Eval/Treat Not Completed: Patient at procedure or test/unavailable. Pt with nursing getting cleaned up. Will continue efforts.  [name], [place], [place] Speech-Language Pathologist [place] 09/26/2017, 10:56 AM

## 2017-09-26 NOTE — Progress Notes (Signed)
  Speech Language Pathology Treatment: Dysphagia  Patient Details Name: Jonathon Keller MRN: [id number] DOB: Sep 29, 1940 Today's Date: 09/26/2017 Time: 1017-5102 SLP Time Calculation (min) (ACUTE ONLY): 8 min  Assessment / Plan / Recommendation Clinical Impression  Pt with immediate coughing post-swallow with thin liquids via straw. Facial twitching noted prior to and during consumption of POs; oral phase appears prolonged. Given motor and suspected sensory deficits, recommend MBS for objective evaluation of swallowing function. Will update recommendations after completion of MBS.    HPI HPI: 77 yo male adm to [facility] with AMS, confusion and left sided weakness.  Pt CT chest showed bilateral ATX 09/23/17.  PMH + for anxiety, depression, tobacco use. 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  He is on seizure precautions and neuro surgery has been consulted.       SLP Plan  MBS;New goals to be determined pending instrumental study       Recommendations  Diet recommendations: NPO(pending MBS)                Oral Care Recommendations: Oral care QID Follow up Recommendations: Other (comment)(tba) SLP Visit Diagnosis: Dysphagia, oropharyngeal phase (R13.12) Plan: MBS;New goals to be determined pending instrumental study       [place], [place], [place] Pathologist [phone]  [name] 09/26/2017, 12:35 PM

## 2017-09-27 LAB — GLUCOSE, CAPILLARY: GLUCOSE-CAPILLARY: 87 mg/dL (ref 65–99)

## 2017-09-27 NOTE — Progress Notes (Signed)
PROGRESS NOTE  Jonathon Keller  OIZ:[id number] DOB: 1940/06/25 DOA: 09/23/2017 PCP: [name], MD   Brief Narrative: Jonathon Keller is a 77 y.o. male with a history of dementia and tobacco use who presented from ILF with left-sided weakness and confusion beyond baseline. His sister reports steady cognitive decline over the past few weeks, worse especially in past few days, and left-sided facial droop and a couple minute-long episodes of facial twitching prompted his visit to the ED 5/2. CT head showed a 2.9cm well-circumscribed heterogenous mass in right frontal lobe with edema and mass effect, 70mm R>L midline shift. Neurology was consulted, decadron started. For concern of seizures keppra was loaded and continued, EEG pending. CT chest, abdomen, and pelvis performed without primary source. Neurosurgery, Dr. Christella Noa, consulted for consideration of biopsy/excision. The patient experienced some delirium so AED was changed (keppra to depakote). However, facial twitching continued so neurology has added back keppra and ordered repeat head CT which was stable. Neurosurgery is planning excision of right frontal mass.  Assessment & Plan: Principal Problem:   Brain mass Active Problems:   Dementia   Left-sided weakness   Tobacco abuse   Acute metabolic encephalopathy  Right frontal brain mass: With mass effect/edema causing R > L 59mm midline shift. Likely cause of progressive decline and current disinhibited features, possibly cause of weakness. No primary found on CT chest/abd/pelvis. MRI brain re-demonstrated the pass with vasogenic edema and stable midline shift. - Neurology following: Continuing depakote (level within therapeutic range) and repeat keppra load and maintenance.  - Repeat CT head given worsening left hemineglect was stable  - Neurosurgery, Dr. Christella Noa, planning excision, has discussed with patient and family. - Continue decadron 6mg  q6h - Continue with seizure precautions.  -  PT/OT/SLP evaluations continue. Will require SNF.  Acute encephalopathy on chronic dementia: With element of acute delirium.  - Minimize restraints and medications as much as possible. Sitter at bedside as able.  - Delirium precautions  Tobacco use:  - Patch ordered.    DVT prophylaxis: Heparin Code Status: DNR confirmed on admission Family Communication: Discussed with sister and brother-in-law at bedside for 15 minutes 5/5. Disposition Plan: Suspect his current ongoing encephalopathy would require escalation of care from independent living to SNF with 24hr supervision. Pending further neurosurgical intervention  Consultants:   Neurology, [facility]  Neurosurgery, [facility]  Procedures:   None  Antimicrobials:  None   Subjective: No complaints, continues to exhibit impulsiveness and encephalopathy per staff. Calm this AM.   Objective: Vitals:   09/26/17 1535 09/26/17 1959 09/27/17 0003 09/27/17 0347  BP: 138/73 129/79 120/68 (!) 109/56  Pulse: 90 76 80 (!) 40  Resp: 18 18 18 18   Temp: 98 F (36.7 C) 98 F (36.7 C) 98 F (36.7 C) (!) 97.5 F (36.4 C)  TempSrc: Oral Oral Oral Oral  SpO2: (!) 83% 98% 99% 99%  Weight:      Height:        Intake/Output Summary (Last 24 hours) at 09/27/2017 1027 Last data filed at 09/27/2017 3382 Gross per 24 hour  Intake 1771.25 ml  Output -  Net 1771.25 ml   Filed Weights   09/23/17 2255  Weight: 71.8 kg (158 lb 4.6 oz)    Gen: 77 y.o. male in no distress Pulm: Nonlabored, clear CV: RRR, no M/R/G, no edema. GI: Abdomen soft, NT, ND, +BS Ext: Warm, no deformities. Mittens on hands and posey belt without evidence of abrasion.  Skin: No rashes, lesions or ulcers Neuro: Alert,  not oriented. Following some commands. Left faical droop and LUE weakness stable. Left leg about 4/5. Right sided 5/5 throughout. Psych: Judgement and insight appear impaired by encephalopathy. Mood & affect appropriate.   Data Reviewed: I have  personally reviewed following labs and imaging studies  CBC: Recent Labs  Lab 09/23/17 1714 09/23/17 1716 09/24/17 0544  WBC  --  10.4 8.0  NEUTROABS  --  8.3*  --   HGB 14.6 13.9 14.3  HCT 43.0 41.7 42.5  MCV  --  92.9 92.6  PLT  --  356 387   Basic Metabolic Panel: Recent Labs  Lab 09/23/17 1714 09/23/17 1716 09/24/17 0544  NA 139 139 137  K 4.7 4.8 4.2  CL 101 102 102  CO2  --  29 23  GLUCOSE 102* 103* 124*  BUN 18 14 16   CREATININE 1.10 1.14 0.99  CALCIUM  --  9.4 9.3   GFR: Estimated Creatinine Clearance: 62.5 mL/min (by C-G formula based on SCr of 0.99 mg/dL). Liver Function Tests: Recent Labs  Lab 09/23/17 1716  AST 31  ALT 18  ALKPHOS 106  BILITOT 1.0  PROT 7.9  ALBUMIN 3.9   No results for input(s): LIPASE, AMYLASE in the last 168 hours. No results for input(s): AMMONIA in the last 168 hours. Coagulation Profile: Recent Labs  Lab 09/23/17 1716  INR 1.12   Cardiac Enzymes: No results for input(s): CKTOTAL, CKMB, CKMBINDEX, TROPONINI in the last 168 hours. BNP (last 3 results) No results for input(s): PROBNP in the last 8760 hours. HbA1C: No results for input(s): HGBA1C in the last 72 hours. CBG: Recent Labs  Lab 09/23/17 1739 09/24/17 1154 09/26/17 0628 09/27/17 0634  GLUCAP 96 132* 113* 87   Lipid Profile: No results for input(s): CHOL, HDL, LDLCALC, TRIG, CHOLHDL, LDLDIRECT in the last 72 hours. Thyroid Function Tests: No results for input(s): TSH, T4TOTAL, FREET4, T3FREE, THYROIDAB in the last 72 hours. Anemia Panel: No results for input(s): VITAMINB12, FOLATE, FERRITIN, TIBC, IRON, RETICCTPCT in the last 72 hours. Urine analysis: No results found for: COLORURINE, APPEARANCEUR, LABSPEC, PHURINE, GLUCOSEU, HGBUR, BILIRUBINUR, KETONESUR, PROTEINUR, UROBILINOGEN, NITRITE, LEUKOCYTESUR No results found for this or any previous visit (from the past 240 hour(s)).    Radiology Studies: Ct Head Wo Contrast  Result Date:  09/26/2017 CLINICAL DATA:  Initial evaluation for altered mental status, brain neoplasm. EXAM: CT HEAD WITHOUT CONTRAST TECHNIQUE: Contiguous axial images were obtained from the base of the skull through the vertex without intravenous contrast. COMPARISON:  Previous MRI from 09/25/2017 as well as prior head CT from 09/23/2017. FINDINGS: Brain: Previously identified well-circumscribed heterogeneous mass at the right frontal lobe again seen, stable in size measuring 2.5 x 2.6 x 2.8 cm. Surrounding vasogenic edema with regional mass effect with up to 4 mm of right-to-left shift is unchanged. No evidence for hydrocephalus or ventricular trapping. Basilar cisterns remain patent. Stable atrophy with chronic small vessel ischemic disease. No acute intracranial hemorrhage. No other acute large vessel territory infarct. No other mass lesion. No extra-axial fluid collection. Vascular: No hyperdense vessel. Scattered vascular calcifications noted within the carotid siphons. Skull: Scalp soft tissues and calvarium within normal limits. Sinuses/Orbits: Globes and orbital soft tissues within normal limits. Left maxillary sinus retention cyst noted. Paranasal sinuses and mastoid air cells are otherwise clear. Other: None. IMPRESSION: 1. Stable size and appearance of heterogeneous right frontal mass with associated edema, regional mass effect, and 4 mm of right-to-left shift. 2. No other new acute intracranial abnormality. Electronically  Signed   By: Jeannine Boga M.D.   On: 09/26/2017 16:25   Mr [name] IW Contrast  Result Date: 09/25/2017 CLINICAL DATA:  Initial evaluation for intracranial mass. EXAM: MRI HEAD WITHOUT AND WITH CONTRAST TECHNIQUE: Multiplanar, multiecho pulse sequences of the brain and surrounding structures were obtained without and with intravenous contrast. CONTRAST:  28mL MULTIHANCE GADOBENATE DIMEGLUMINE 529 MG/ML IV SOLN COMPARISON:  Prior CT from 09/23/2017. FINDINGS: Brain: Heterogeneously and  avidly enhancing mass positioned within the right frontal operculum measures 2.7 x 2.7 x 2.8 cm (series 20001, image 41). This corresponds with abnormality seen on prior CT. Scattered foci of internal susceptibility artifact consistent with necrosis and/or blood products. Surrounding vasogenic edema with regional mass effect within the adjacent cerebral hemisphere. Partial effacement of the right lateral ventricle with 4 mm of right-to-left shift. No hydrocephalus or ventricular trapping. Basilar cisterns remain widely patent. Scattered leptomeningeal enhancement within the adjacent right frontal lobe felt to be related to edema. Note made of an additional punctate nodular focus of apparent enhancement within the inferior left cerebellar hemisphere, faintly visible on axial post gad sequence (series 20001, image 11), but also visible on coronal sequence (series 21001, image 12) as well as sagittal sequence (series 22001, image 15). No definite T2/FLAIR correlate. Finding is nonspecific, but favored to be either artifactual or vascular in nature. Attention at follow-up is warranted however. No other abnormal enhancement within the brain. No other mass lesion. No evidence for acute infarct. Underlying atrophy with mild chronic small vessel ischemic disease. No extra-axial fluid collection. Major dural sinuses patent. Pituitary gland suprasellar region normal. Midline structures intact. Vascular: Major intracranial vascular flow voids are maintained. Skull and upper cervical spine: Craniocervical junction normal. Upper cervical spine normal. Bone marrow signal intensity within normal limits. No scalp soft tissue abnormality. Sinuses/Orbits: Globes and orbital soft tissues within normal limits. Patient status post lens extraction bilaterally. Left maxillary sinus retention cyst noted. Paranasal sinuses are otherwise clear. No mastoid effusion. Inner ear structures normal. Other: None. IMPRESSION: 1. 2.2 x 2.7 x 2.8 cm  enhancing right frontal lobe mass, indeterminate. Primary differential considerations include high-grade glioma versus solitary intracranial metastasis. Associated vasogenic edema with regional mass effect with 4 mm of right-to-left shift. 2. Additional punctate nodular focus of enhancement within the inferior left cerebellar hemisphere, favored to be either artifactual and/or vascular in nature, although attention at follow-up is recommended. Electronically Signed   By: Jeannine Boga M.D.   On: 09/25/2017 16:50   Dg Swallowing Func-speech Pathology  Result Date: 09/26/2017 Objective Swallowing Evaluation: Type of Study: MBS-Modified Barium Swallow Study  Patient Details Name: Jonathon Keller MRN: [id number] Date of Birth: Jan 18, 1941 Today's Date: 09/26/2017 Time: SLP Start Time (ACUTE ONLY): 1220 -SLP Stop Time (ACUTE ONLY): 1245 SLP Time Calculation (min) (ACUTE ONLY): 25 min Past Medical History: Past Medical History: Diagnosis Date . Allergy  . Anxiety  . Cataract  . Dementia  . Depression  . Eczema  . Tobacco abuse  Past Surgical History: No past surgical history on file. HPI: 77 yo male adm [facility] with AMS, confusion and left sided weakness.  Pt CT chest showed bilateral ATX 09/23/17.  PMH + for anxiety, depression, tobacco use. 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  He is on seizure precautions and neuro surgery has been consulted.  Subjective: pt removing blankets, towel Assessment / Plan / Recommendation CHL IP CLINICAL IMPRESSIONS 09/26/2017 Clinical Impression Pt presents with moderate sensorimotor dysphagia  impacting both oral and pharyngeal stages of the swallow; pt aspirated both thin and nectar thick liquids. Oral stage characterized by oral holding, prolonged oral preparation, decreased bolus cohesion and premature spillage of liquids; mild-moderate oral residue on occasion. With solids, there is prolonged mastication and delayed oral transit. Pharyngeal stage is noted for  decreased hyolaryngeal excursion resulting in reduced airway closure and vallecular (puree: moderate, soft solid: mod-severe, liquids: mild) and pyriform (thin, nectar: mild) residue. Pt does not follow commands for second swallow, dry spoon ineffective to cue. Penetration of thin via cup, which spilled into the open airway before the swallow with trace silent aspiration during the swallow. Pt does not follow cues for cough or compensatory manuevers. Thin residue in the pyriform sinuses was also penetrated during subsequent swallow of nectar thick liquid. Consistent, shallow penetration of nectar occurs due to delayed, incomplete closure of the laryngeal vestibule. Pt not responsive to cues to clear airway. With larger sip of nectar, there was apparent premature spillage with moderate aspiration not captured in the frame, however barium observed in the trachea and coating pt's vocal cords. Pt did have a weak, delayed cough response to this larger volume of aspiration. There was no penetration or aspiration with honey thick liquids, purees or soft solid. 13 mm barium tablet passed throught the cervical esophagus without difficulty. Pt impulsive, restless; exam was ended before esophageal sweep as pt began removing his seatbelt and attempting to get up from the chair. Recommend dys 1, honey thick liquids with full supervision due to impulsivity. Meds can be given whole in puree. Will follow up for tolerance and pt/family education. SLP Visit Diagnosis Dysphagia, oropharyngeal phase (R13.12) Attention and concentration deficit following -- Frontal lobe and executive function deficit following -- Impact on safety and function Moderate aspiration risk   CHL IP TREATMENT RECOMMENDATION 09/26/2017 Treatment Recommendations Therapy as outlined in treatment plan below   Prognosis 09/26/2017 Prognosis for Safe Diet Advancement Fair Barriers to Reach Goals Cognitive deficits;Other (Comment) Barriers/Prognosis Comment -- CHL IP  DIET RECOMMENDATION 09/26/2017 SLP Diet Recommendations Dysphagia 1 (Puree) solids;Honey thick liquids Liquid Administration via Cup Medication Administration Whole meds with puree Compensations Minimize environmental distractions;Slow rate;Small sips/bites;Lingual sweep for clearance of pocketing;Follow solids with liquid Postural Changes Seated upright at 90 degrees   CHL IP OTHER RECOMMENDATIONS 09/26/2017 Recommended Consults -- Oral Care Recommendations Oral care BID Other Recommendations Order thickener from pharmacy;Prohibited food (jello, ice cream, thin soups);Remove water pitcher;Have oral suction available   CHL IP FOLLOW UP RECOMMENDATIONS 09/26/2017 Follow up Recommendations Other (comment)   CHL IP FREQUENCY AND DURATION 09/26/2017 Speech Therapy Frequency (ACUTE ONLY) min 2x/week Treatment Duration 2 weeks      CHL IP ORAL PHASE 09/26/2017 Oral Phase Impaired Oral - Pudding Teaspoon -- Oral - Pudding Cup -- Oral - Honey Teaspoon -- Oral - Honey Cup -- Oral - Nectar Teaspoon -- Oral - Nectar Cup -- Oral - Nectar Straw -- Oral - Thin Teaspoon Left anterior bolus loss;Holding of bolus;Delayed oral transit;Decreased bolus cohesion;Premature spillage Oral - Thin Cup Left anterior bolus loss;Holding of bolus;Delayed oral transit;Decreased bolus cohesion;Premature spillage;Lingual/palatal residue Oral - Thin Straw -- Oral - Puree Reduced posterior propulsion;Holding of bolus;Delayed oral transit;Decreased bolus cohesion;Lingual/palatal residue Oral - Mech Soft Impaired mastication;Reduced posterior propulsion;Holding of bolus;Lingual/palatal residue;Delayed oral transit;Decreased bolus cohesion Oral - Regular -- Oral - Multi-Consistency -- Oral - Pill Delayed oral transit Oral Phase - Comment --  CHL IP PHARYNGEAL PHASE 09/26/2017 Pharyngeal Phase Impaired Pharyngeal- Pudding Teaspoon -- Pharyngeal --  Pharyngeal- Pudding Cup -- Pharyngeal -- Pharyngeal- Honey Teaspoon -- Pharyngeal -- Pharyngeal- Honey Cup Delayed  swallow initiation-vallecula;Reduced laryngeal elevation;Pharyngeal residue - valleculae Pharyngeal -- Pharyngeal- Nectar Teaspoon Delayed swallow initiation-pyriform sinuses;Reduced epiglottic inversion;Reduced laryngeal elevation;Reduced anterior laryngeal mobility;Reduced airway/laryngeal closure;Penetration/Aspiration during swallow;Pharyngeal residue - valleculae;Pharyngeal residue - pyriform Pharyngeal Material enters airway, remains ABOVE vocal cords and not ejected out Pharyngeal- Nectar Cup Delayed swallow initiation-pyriform sinuses;Reduced epiglottic inversion;Reduced anterior laryngeal mobility;Reduced laryngeal elevation;Reduced airway/laryngeal closure;Penetration/Aspiration before swallow;Penetration/Aspiration during swallow;Moderate aspiration;Pharyngeal residue - valleculae;Pharyngeal residue - pyriform Pharyngeal Material enters airway, remains ABOVE vocal cords and not ejected out;Material enters airway, passes BELOW cords and not ejected out despite cough attempt by patient Pharyngeal- Nectar Straw -- Pharyngeal -- Pharyngeal- Thin Teaspoon Delayed swallow initiation-vallecula;Reduced anterior laryngeal mobility;Reduced laryngeal elevation;Reduced airway/laryngeal closure;Pharyngeal residue - valleculae;Pharyngeal residue - pyriform Pharyngeal -- Pharyngeal- Thin Cup Delayed swallow initiation-pyriform sinuses;Reduced epiglottic inversion;Reduced anterior laryngeal mobility;Reduced laryngeal elevation;Reduced airway/laryngeal closure;Penetration/Aspiration before swallow;Penetration/Aspiration during swallow;Penetration/Apiration after swallow;Pharyngeal residue - valleculae;Pharyngeal residue - pyriform;Trace aspiration Pharyngeal Material enters airway, passes BELOW cords without attempt by patient to eject out (silent aspiration);Material enters airway, remains ABOVE vocal cords and not ejected out Pharyngeal- Thin Straw -- Pharyngeal -- Pharyngeal- Puree Delayed swallow  initiation-vallecula;Reduced anterior laryngeal mobility;Reduced laryngeal elevation;Pharyngeal residue - valleculae Pharyngeal -- Pharyngeal- Mechanical Soft Delayed swallow initiation-vallecula;Reduced laryngeal elevation;Reduced anterior laryngeal mobility;Pharyngeal residue - valleculae Pharyngeal -- Pharyngeal- Regular -- Pharyngeal -- Pharyngeal- Multi-consistency -- Pharyngeal -- Pharyngeal- Pill Delayed swallow initiation-vallecula Pharyngeal -- Pharyngeal Comment --  CHL IP CERVICAL ESOPHAGEAL PHASE 09/26/2017 Cervical Esophageal Phase WFL Pudding Teaspoon -- Pudding Cup -- Honey Teaspoon -- Honey Cup -- Nectar Teaspoon -- Nectar Cup -- Nectar Straw -- Thin Teaspoon -- Thin Cup -- Thin Straw -- Puree -- Mechanical Soft -- Regular -- Multi-consistency -- Pill -- Cervical Esophageal Comment -- [name], MS, CCC-SLP Speech-Language Pathologist [phone] No flowsheet data found. [name] 09/26/2017, 1:23 PM               Scheduled Meds: . dexamethasone  6 mg Intravenous Q6H  . divalproex  750 mg Oral Q12H  . heparin  5,000 Units Subcutaneous Q8H  . nicotine  21 mg Transdermal Daily   Continuous Infusions: . sodium chloride 75 mL/hr at 09/23/17 2303  . levETIRAcetam 1,000 mg (09/27/17 0725)     LOS: 4 days   Time spent: 25 minutes.  Patrecia Pour, MD [facility] Pager [phone]  If 7PM-7AM, please contact night-coverage [email or web address] Password [email or web address] 09/27/2017, 10:27 AM

## 2017-09-27 NOTE — Progress Notes (Signed)
Patient ID: Jonathon Keller, male   DOB: 1941-02-24, 77 y.o.   MRN: [id number] BP (!) 153/82 (BP Location: Left Arm)   Pulse 98   Temp 97.9 F (36.6 C) (Axillary)   Resp 18   Ht 5\' 9"  (1.753 m)   Wt 71.8 kg (158 lb 4.6 oz)   SpO2 100%   BMI 23.38 kg/m  [name] sister who has the power of attorney is having second thoughts about surgery. I will therefore cancel the case for now, until she has a chance to speak with palliative care. I do not believe that the tumor is causing the mental status changes he is experiencing.

## 2017-09-28 ENCOUNTER — Inpatient Hospital Stay (HOSPITAL_COMMUNITY): Payer: Medicare HMO | Admitting: Certified Registered"

## 2017-09-28 ENCOUNTER — Encounter (HOSPITAL_COMMUNITY)
Admission: EM | Disposition: A | Discharge status: Hospice | Payer: Self-pay | Source: Home / Self Care | Attending: Family Medicine

## 2017-09-28 ENCOUNTER — Encounter (HOSPITAL_COMMUNITY): Payer: Self-pay | Admitting: Anesthesiology

## 2017-09-28 LAB — GLUCOSE, CAPILLARY
Glucose-Capillary: 90 mg/dL (ref 65–99)
Glucose-Capillary: 109 mg/dL — ABNORMAL HIGH (ref 65–99)

## 2017-09-28 SURGERY — CRANIOTOMY TUMOR EXCISION
Anesthesia: General

## 2017-09-28 MED ORDER — LORAZEPAM 2 MG/ML IJ SOLN: 0.5000 mg | Freq: Four times a day (QID) | INTRAMUSCULAR | Status: DC | PRN

## 2017-09-28 MED ORDER — RESOURCE THICKENUP CLEAR PO POWD: ORAL | Status: DC | PRN

## 2017-09-28 NOTE — Progress Notes (Addendum)
  Speech Language Pathology Treatment: Dysphagia  Patient Details Name: Jonathon Keller MRN: [id number] DOB: 1940/10/24 Today's Date: 09/28/2017 Time: 4098-1191 SLP Time Calculation (min) (ACUTE ONLY): 30 min  Assessment / Plan / Recommendation Clinical Impression  Pt today full alert, remains aphasic stating "uh huh" frequently to questions.  Sitter present and reports intake of 120 liquids and 25% of meal with no coughing noted.   NT reports pt consumes po very rapidly and SLP observed this also therefore recommend to continue full supervision for safety.  He required total tactile cues to small boluses. Pt's dentures located in room but he declined to have them placed- therefore recommend continue puree foods.  Observed pt with Ensure honey thick with great tolerance and intake of 4 ounces!  Encourage pt to continue liquid supplement recommended to maximize nutrition.    Anterior labial spill minimal on left noted - check for oral pocketing please.   RN in room adjusting IV - concern for its viability - recommend push liquids.  SLP tested pt with advancement of thin water = cough x2/10 boluses which on MBS was indicative of larger aspiration amount.  SLP to follow up for readiness for dietary advancement and po tolerance/family education.    HPI HPI: 77 yo male adm to [facility] with AMS, confusion and left sided weakness.  Pt CT chest showed bilateral ATX 09/23/17.  PMH + for anxiety, depression, tobacco use. 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  He is on seizure precautions and neuro surgery has been consulted.       SLP Plan  Continue with current plan of care       Recommendations  Diet recommendations: Dysphagia 1 (puree);Honey-thick liquid Liquids provided via: Cup Medication Administration: Whole meds with puree Supervision: Full supervision/cueing for compensatory strategies Compensations: Minimize environmental distractions;Slow rate;Small sips/bites;Lingual sweep for  clearance of pocketing Postural Changes and/or Swallow Maneuvers: Seated upright 90 degrees;Upright 30-60 min after meal                Oral Care Recommendations: Oral care BID Follow up Recommendations: Other (comment)(tba) SLP Visit Diagnosis: Dysphagia, oropharyngeal phase (R13.12) Plan: Continue with current plan of care       GO              Luanna Salk, [place] [facility] SLP Young Harris, [place] 09/28/2017, 9:56 AM

## 2017-09-28 NOTE — Progress Notes (Signed)
Patient ID: Jonathon Keller, male   DOB: 11/15/1940, 77 y.o.   MRN: [id number] BP (!) 146/59 (BP Location: Right Arm)   Pulse 89   Temp 98 F (36.7 C) (Oral)   Resp 18   Ht 5\' 9"  (1.753 m)   Wt 71.8 kg (158 lb 4.6 oz)   SpO2 99%   BMI 23.38 kg/m  Patient not oriented to place, situation, time, knows name His sister has at this time would like to forgo surgery. Cancelled for today, and likely no surgery will be done.

## 2017-09-28 NOTE — Progress Notes (Signed)
Chart reviewed. No family at bedside. Spoke with sister, [name], via telephone. [name] states "he really wants to die" and believes "surgery is prolonging the inevitable." She speaks of wanting to "make him comfortable." [name] and I have a meeting arranged for tomorrow, 5/8 at 10:30am to further discuss goals of care.   NO CHARGE  Ihor Dow, FNP-C Palliative Medicine Team  Phone: [phone] Fax: [phone]

## 2017-09-28 NOTE — Care Management Important Message (Signed)
Important Message  Patient Details  Name: Jonathon Keller MRN: [id number] Date of Birth: [birth year]   Medicare Important Message Given:  Yes    Hannibal Skalla 09/28/2017, 11:10 AM

## 2017-09-28 NOTE — Progress Notes (Signed)
PROGRESS NOTE  Jonathon Keller  PZW:[id number] DOB: 07/20/1940 DOA: 09/23/2017 PCP: Jonathon Rasmussen, MD   Brief Narrative: [name] is a 77 y.o. male with a history of dementia and tobacco use who presented from ILF with left-sided weakness and confusion beyond baseline. His sister reports steady cognitive decline over the past few weeks, worse especially in past few days, and left-sided facial droop and a couple minute-long episodes of facial twitching prompted his visit to the ED 5/2. CT head showed a 2.9cm well-circumscribed heterogenous mass in right frontal lobe with edema and mass effect, 39mm R>L midline shift. Neurology was consulted, decadron started. For concern of seizures keppra was loaded and continued, EEG pending. CT chest, abdomen, and pelvis performed without primary source. Neurosurgery, Dr. Christella Keller, consulted for consideration of biopsy/excision. The patient experienced some delirium so AED was changed (keppra to depakote). However, facial twitching continued so neurology has added back keppra and ordered repeat head CT which was stable. Neurosurgery was consulted and recommended excision of right frontal mass. Palliative care has been consulted to discuss goals of care and disposition with family.   Assessment & Plan: Principal Problem:   Brain mass Active Problems:   Dementia   Left-sided weakness   Tobacco abuse   Acute metabolic encephalopathy  Right frontal brain mass: With mass effect/edema causing R > L 52mm midline shift. Likely cause of progressive decline and current disinhibited features, possibly cause of weakness. No primary found on CT chest/abd/pelvis. MRI brain re-demonstrated the pass with vasogenic edema and stable midline shift. Repeat CT head given worsening left hemineglect was stable  - Neurology following: Continuing depakote (level within therapeutic range) and keppra. - Neurosurgery, Dr. Christella Keller recommended excision, though family prefers to discuss further  with palliative care. With current level of functioning, he may require SNF with palliative care services. - Continue decadron 6mg  q6h - Continue with seizure precautions.   Acute encephalopathy on chronic dementia: With element of acute delirium.  - Minimize restraints and medications as much as possible. Sitter at bedside. - Delirium precautions  Tobacco use:  - Patch ordered.    DVT prophylaxis: Heparin Code Status: DNR confirmed on admission Family Communication: Discussed with sister and brother-in-law at bedside for 15 minutes 5/5. Will discuss again today further.  Disposition Plan: Suspect his current ongoing encephalopathy would require escalation of care from independent living to SNF with 24hr supervision. Pending further palliative care discussions and possible neurosurgical intervention  Consultants:   Neurology, [facility]  Neurosurgery, [place] Team  Procedures:   None  Antimicrobials:  None   Subjective: Ate breakfast, has no complaints. Sitter without reports of agitation this morning.   Objective: Vitals:   09/27/17 1630 09/27/17 2049 09/28/17 0020 09/28/17 0403  BP: (!) 153/82 (!) 155/74 134/77 (!) 141/87  Pulse: 98 66 72 (!) 50  Resp: 18 18 20 20   Temp: 97.9 F (36.6 C) 98.1 F (36.7 C) 98 F (36.7 C) 98.2 F (36.8 C)  TempSrc: Axillary Oral Oral Axillary  SpO2: 100% 98% 99% 100%  Weight:      Height:        Intake/Output Summary (Last 24 hours) at 09/28/2017 1122 Last data filed at 09/28/2017 0900 Gross per 24 hour  Intake 120 ml  Output -  Net 120 ml   Filed Weights   09/23/17 2255  Weight: 71.8 kg (158 lb 4.6 oz)    Gen: 77 y.o. male in no distress Pulm: Nonlabored, clear CV: RRR, no M/R/G,  no edema. GI: Abdomen soft, NT, ND, +BS Ext: Warm, no deformities. No restraints in place currently.  Skin: No rashes, lesions or ulcers Neuro: Alert, not oriented. Suspect left hemineglect with left arm > leg weakness  compared to right, but moves all extremities. Left facial droop stable.  Psych: Judgement and insight appear impaired by encephalopathy. Mood & affect appropriate.   Data Reviewed: I have personally reviewed following labs and imaging studies  CBC: Recent Labs  Lab 09/23/17 1714 09/23/17 1716 09/24/17 0544  WBC  --  10.4 8.0  NEUTROABS  --  8.3*  --   HGB 14.6 13.9 14.3  HCT 43.0 41.7 42.5  MCV  --  92.9 92.6  PLT  --  356 841   Basic Metabolic Panel: Recent Labs  Lab 09/23/17 1714 09/23/17 1716 09/24/17 0544  NA 139 139 137  K 4.7 4.8 4.2  CL 101 102 102  CO2  --  29 23  GLUCOSE 102* 103* 124*  BUN 18 14 16   CREATININE 1.10 1.14 0.99  CALCIUM  --  9.4 9.3   GFR: Estimated Creatinine Clearance: 62.5 mL/min (by C-G formula based on SCr of 0.99 mg/dL). Liver Function Tests: Recent Labs  Lab 09/23/17 1716  AST 31  ALT 18  ALKPHOS 106  BILITOT 1.0  PROT 7.9  ALBUMIN 3.9   No results for input(s): LIPASE, AMYLASE in the last 168 hours. No results for input(s): AMMONIA in the last 168 hours. Coagulation Profile: Recent Labs  Lab 09/23/17 1716  INR 1.12   Cardiac Enzymes: No results for input(s): CKTOTAL, CKMB, CKMBINDEX, TROPONINI in the last 168 hours. BNP (last 3 results) No results for input(s): PROBNP in the last 8760 hours. HbA1C: No results for input(s): HGBA1C in the last 72 hours. CBG: Recent Labs  Lab 09/24/17 1154 09/26/17 0628 09/27/17 0634 09/28/17 0606 09/28/17 0745  GLUCAP 132* 113* 87 90 109*   Lipid Profile: No results for input(s): CHOL, HDL, LDLCALC, TRIG, CHOLHDL, LDLDIRECT in the last 72 hours. Thyroid Function Tests: No results for input(s): TSH, T4TOTAL, FREET4, T3FREE, THYROIDAB in the last 72 hours. Anemia Panel: No results for input(s): VITAMINB12, FOLATE, FERRITIN, TIBC, IRON, RETICCTPCT in the last 72 hours. Urine analysis: No results found for: COLORURINE, APPEARANCEUR, LABSPEC, PHURINE, GLUCOSEU, HGBUR, BILIRUBINUR,  KETONESUR, PROTEINUR, UROBILINOGEN, NITRITE, LEUKOCYTESUR No results found for this or any previous visit (from the past 240 hour(s)).    Radiology Studies: Ct Head Wo Contrast  Result Date: 09/26/2017 CLINICAL DATA:  Initial evaluation for altered mental status, brain neoplasm. EXAM: CT HEAD WITHOUT CONTRAST TECHNIQUE: Contiguous axial images were obtained from the base of the skull through the vertex without intravenous contrast. COMPARISON:  Previous MRI from 09/25/2017 as well as prior head CT from 09/23/2017. FINDINGS: Brain: Previously identified well-circumscribed heterogeneous mass at the right frontal lobe again seen, stable in size measuring 2.5 x 2.6 x 2.8 cm. Surrounding vasogenic edema with regional mass effect with up to 4 mm of right-to-left shift is unchanged. No evidence for hydrocephalus or ventricular trapping. Basilar cisterns remain patent. Stable atrophy with chronic small vessel ischemic disease. No acute intracranial hemorrhage. No other acute large vessel territory infarct. No other mass lesion. No extra-axial fluid collection. Vascular: No hyperdense vessel. Scattered vascular calcifications noted within the carotid siphons. Skull: Scalp soft tissues and calvarium within normal limits. Sinuses/Orbits: Globes and orbital soft tissues within normal limits. Left maxillary sinus retention cyst noted. Paranasal sinuses and mastoid air cells are otherwise clear. Other:  None. IMPRESSION: 1. Stable size and appearance of heterogeneous right frontal mass with associated edema, regional mass effect, and 4 mm of right-to-left shift. 2. No other new acute intracranial abnormality. Electronically Signed   By: Jeannine Boga M.D.   On: 09/26/2017 16:25   Dg Swallowing Func-speech Pathology  Result Date: 09/26/2017 Objective Swallowing Evaluation: Type of Study: MBS-Modified Barium Swallow Study  Patient Details Name: Jonathon Keller MRN: [id number] Date of Birth: 1940-07-20 Today's Date: 09/26/2017  Time: SLP Start Time (ACUTE ONLY): 1220 -SLP Stop Time (ACUTE ONLY): 1245 SLP Time Calculation (min) (ACUTE ONLY): 25 min Past Medical History: Past Medical History: Diagnosis Date . Allergy  . Anxiety  . Cataract  . Dementia  . Depression  . Eczema  . Tobacco abuse  Past Surgical History: No past surgical history on file. HPI: 77 yo male adm to [facility] with AMS, confusion and left sided weakness.  Pt CT chest showed bilateral ATX 09/23/17.  PMH + for anxiety, depression, tobacco use. 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  He is on seizure precautions and neuro surgery has been consulted.  Subjective: pt removing blankets, towel Assessment / Plan / Recommendation CHL IP CLINICAL IMPRESSIONS 09/26/2017 Clinical Impression Pt presents with moderate sensorimotor dysphagia impacting both oral and pharyngeal stages of the swallow; pt aspirated both thin and nectar thick liquids. Oral stage characterized by oral holding, prolonged oral preparation, decreased bolus cohesion and premature spillage of liquids; mild-moderate oral residue on occasion. With solids, there is prolonged mastication and delayed oral transit. Pharyngeal stage is noted for decreased hyolaryngeal excursion resulting in reduced airway closure and vallecular (puree: moderate, soft solid: mod-severe, liquids: mild) and pyriform (thin, nectar: mild) residue. Pt does not follow commands for second swallow, dry spoon ineffective to cue. Penetration of thin via cup, which spilled into the open airway before the swallow with trace silent aspiration during the swallow. Pt does not follow cues for cough or compensatory manuevers. Thin residue in the pyriform sinuses was also penetrated during subsequent swallow of nectar thick liquid. Consistent, shallow penetration of nectar occurs due to delayed, incomplete closure of the laryngeal vestibule. Pt not responsive to cues to clear airway. With larger sip of nectar, there was apparent premature  spillage with moderate aspiration not captured in the frame, however barium observed in the trachea and coating pt's vocal cords. Pt did have a weak, delayed cough response to this larger volume of aspiration. There was no penetration or aspiration with honey thick liquids, purees or soft solid. 13 mm barium tablet passed throught the cervical esophagus without difficulty. Pt impulsive, restless; exam was ended before esophageal sweep as pt began removing his seatbelt and attempting to get up from the chair. Recommend dys 1, honey thick liquids with full supervision due to impulsivity. Meds can be given whole in puree. Will follow up for tolerance and pt/family education. SLP Visit Diagnosis Dysphagia, oropharyngeal phase (R13.12) Attention and concentration deficit following -- Frontal lobe and executive function deficit following -- Impact on safety and function Moderate aspiration risk   CHL IP TREATMENT RECOMMENDATION 09/26/2017 Treatment Recommendations Therapy as outlined in treatment plan below   Prognosis 09/26/2017 Prognosis for Safe Diet Advancement Fair Barriers to Reach Goals Cognitive deficits;Other (Comment) Barriers/Prognosis Comment -- CHL IP DIET RECOMMENDATION 09/26/2017 SLP Diet Recommendations Dysphagia 1 (Puree) solids;Honey thick liquids Liquid Administration via Cup Medication Administration Whole meds with puree Compensations Minimize environmental distractions;Slow rate;Small sips/bites;Lingual sweep for clearance of pocketing;Follow solids  with liquid Postural Changes Seated upright at 90 degrees   CHL IP OTHER RECOMMENDATIONS 09/26/2017 Recommended Consults -- Oral Care Recommendations Oral care BID Other Recommendations Order thickener from pharmacy;Prohibited food (jello, ice cream, thin soups);Remove water pitcher;Have oral suction available   CHL IP FOLLOW UP RECOMMENDATIONS 09/26/2017 Follow up Recommendations Other (comment)   CHL IP FREQUENCY AND DURATION 09/26/2017 Speech Therapy Frequency  (ACUTE ONLY) min 2x/week Treatment Duration 2 weeks      CHL IP ORAL PHASE 09/26/2017 Oral Phase Impaired Oral - Pudding Teaspoon -- Oral - Pudding Cup -- Oral - Honey Teaspoon -- Oral - Honey Cup -- Oral - Nectar Teaspoon -- Oral - Nectar Cup -- Oral - Nectar Straw -- Oral - Thin Teaspoon Left anterior bolus loss;Holding of bolus;Delayed oral transit;Decreased bolus cohesion;Premature spillage Oral - Thin Cup Left anterior bolus loss;Holding of bolus;Delayed oral transit;Decreased bolus cohesion;Premature spillage;Lingual/palatal residue Oral - Thin Straw -- Oral - Puree Reduced posterior propulsion;Holding of bolus;Delayed oral transit;Decreased bolus cohesion;Lingual/palatal residue Oral - Mech Soft Impaired mastication;Reduced posterior propulsion;Holding of bolus;Lingual/palatal residue;Delayed oral transit;Decreased bolus cohesion Oral - Regular -- Oral - Multi-Consistency -- Oral - Pill Delayed oral transit Oral Phase - Comment --  CHL IP PHARYNGEAL PHASE 09/26/2017 Pharyngeal Phase Impaired Pharyngeal- Pudding Teaspoon -- Pharyngeal -- Pharyngeal- Pudding Cup -- Pharyngeal -- Pharyngeal- Honey Teaspoon -- Pharyngeal -- Pharyngeal- Honey Cup Delayed swallow initiation-vallecula;Reduced laryngeal elevation;Pharyngeal residue - valleculae Pharyngeal -- Pharyngeal- Nectar Teaspoon Delayed swallow initiation-pyriform sinuses;Reduced epiglottic inversion;Reduced laryngeal elevation;Reduced anterior laryngeal mobility;Reduced airway/laryngeal closure;Penetration/Aspiration during swallow;Pharyngeal residue - valleculae;Pharyngeal residue - pyriform Pharyngeal Material enters airway, remains ABOVE vocal cords and not ejected out Pharyngeal- Nectar Cup Delayed swallow initiation-pyriform sinuses;Reduced epiglottic inversion;Reduced anterior laryngeal mobility;Reduced laryngeal elevation;Reduced airway/laryngeal closure;Penetration/Aspiration before swallow;Penetration/Aspiration during swallow;Moderate  aspiration;Pharyngeal residue - valleculae;Pharyngeal residue - pyriform Pharyngeal Material enters airway, remains ABOVE vocal cords and not ejected out;Material enters airway, passes BELOW cords and not ejected out despite cough attempt by patient Pharyngeal- Nectar Straw -- Pharyngeal -- Pharyngeal- Thin Teaspoon Delayed swallow initiation-vallecula;Reduced anterior laryngeal mobility;Reduced laryngeal elevation;Reduced airway/laryngeal closure;Pharyngeal residue - valleculae;Pharyngeal residue - pyriform Pharyngeal -- Pharyngeal- Thin Cup Delayed swallow initiation-pyriform sinuses;Reduced epiglottic inversion;Reduced anterior laryngeal mobility;Reduced laryngeal elevation;Reduced airway/laryngeal closure;Penetration/Aspiration before swallow;Penetration/Aspiration during swallow;Penetration/Apiration after swallow;Pharyngeal residue - valleculae;Pharyngeal residue - pyriform;Trace aspiration Pharyngeal Material enters airway, passes BELOW cords without attempt by patient to eject out (silent aspiration);Material enters airway, remains ABOVE vocal cords and not ejected out Pharyngeal- Thin Straw -- Pharyngeal -- Pharyngeal- Puree Delayed swallow initiation-vallecula;Reduced anterior laryngeal mobility;Reduced laryngeal elevation;Pharyngeal residue - valleculae Pharyngeal -- Pharyngeal- Mechanical Soft Delayed swallow initiation-vallecula;Reduced laryngeal elevation;Reduced anterior laryngeal mobility;Pharyngeal residue - valleculae Pharyngeal -- Pharyngeal- Regular -- Pharyngeal -- Pharyngeal- Multi-consistency -- Pharyngeal -- Pharyngeal- Pill Delayed swallow initiation-vallecula Pharyngeal -- Pharyngeal Comment --  CHL IP CERVICAL ESOPHAGEAL PHASE 09/26/2017 Cervical Esophageal Phase WFL Pudding Teaspoon -- Pudding Cup -- Honey Teaspoon -- Honey Cup -- Nectar Teaspoon -- Nectar Cup -- Nectar Straw -- Thin Teaspoon -- Thin Cup -- Thin Straw -- Puree -- Mechanical Soft -- Regular -- Multi-consistency -- Pill --  Cervical Esophageal Comment -- [name], MS, CCC-SLP Speech-Language Pathologist [phone] No flowsheet data found. Jonathon Keller 09/26/2017, 1:23 PM               Scheduled Meds: . dexamethasone  6 mg Intravenous Q6H  . divalproex  750 mg Oral Q12H  . heparin  5,000 Units Subcutaneous Q8H  . nicotine  21 mg Transdermal Daily   Continuous  Infusions: . sodium chloride 75 mL/hr at 09/23/17 2303  . levETIRAcetam Stopped (09/28/17 0908)     LOS: 5 days   Time spent: 25 minutes.  Patrecia Pour, MD [facility] Pager [phone]  If 7PM-7AM, please contact night-coverage [email or web address] Password [email or web address] 09/28/2017, 11:22 AM

## 2017-09-28 NOTE — Progress Notes (Signed)
Physical Therapy Treatment Patient Details Name: Jonathon Keller MRN: [id number] DOB: 1940/12/18 Today's Date: 09/28/2017    History of Present Illness Pt is a 77 y/o male admitted secondary to L sided weakness and confusion beyond baseline. Pt found to have right frontal brain mass with mass effect/edema causing R > L 68mm midline shift. Neurosurg has been consulted (pending). PMH including but not limited to dementia and tobacco use.    PT Comments    Patient continues to demonstrate cognitive and mobility deficits increasing risk for falls. Pt is impulsive and requires constant directional cues when navigating environment. Continue to progress as tolerated with anticipated d/c to SNF for further skilled PT services.     Follow Up Recommendations  SNF;Supervision/Assistance - 24 hour     Equipment Recommendations  None recommended by PT    Recommendations for Other Services       Precautions / Restrictions Precautions Precautions: Fall Restrictions Weight Bearing Restrictions: No    Mobility  Bed Mobility Overal bed mobility: Modified Independent                Transfers Overall transfer level: Needs assistance Equipment used: None Transfers: Sit to/from Stand Sit to Stand: Supervision         General transfer comment: supervision for safety; pt impulsive  Ambulation/Gait Ambulation/Gait assistance: Min assist Ambulation Distance (Feet): (81ft X2) Assistive device: None Gait Pattern/deviations: Step-through pattern;Decreased stride length;Drifts right/left     General Gait Details: pt unsteady and impulsive and requires constant directional cues; assist for balance especially with turning and directional changes   Stairs             Wheelchair Mobility    Modified Rankin (Stroke Patients Only)       Balance Overall balance assessment: Needs assistance Sitting-balance support: Feet supported Sitting balance-Leahy Scale: Good     Standing  balance support: During functional activity;No upper extremity supported Standing balance-Leahy Scale: Fair                              Cognition Arousal/Alertness: Awake/alert Behavior During Therapy: Restless;Impulsive Overall Cognitive Status: No family/caregiver present to determine baseline cognitive functioning Area of Impairment: Orientation;Memory;Following commands;Safety/judgement;Problem solving;Attention;Awareness                 Orientation Level: Disoriented to;Place;Time;Situation Current Attention Level: Focused Memory: Decreased short-term memory Following Commands: Follows one step commands with increased time;Follows one step commands inconsistently Safety/Judgement: Decreased awareness of safety;Decreased awareness of deficits Awareness: Intellectual Problem Solving: Difficulty sequencing;Requires verbal cues General Comments: pt with mild R side gaze preference and L UE inattention however will cross midline with gaze with cues       Exercises      General Comments        Pertinent Vitals/Pain Pain Assessment: No/denies pain    Home Living                      Prior Function            PT Goals (current goals can now be found in the care plan section) Acute Rehab PT Goals Patient Stated Goal: unable to state PT Goal Formulation: Patient unable to participate in goal setting Time For Goal Achievement: 10/09/17 Potential to Achieve Goals: Good Progress towards PT goals: Progressing toward goals    Frequency    Min 3X/week      PT Plan Current plan remains appropriate  Co-evaluation              AM-PAC PT "6 Clicks" Daily Activity  Outcome Measure  Difficulty turning over in bed (including adjusting bedclothes, sheets and blankets)?: None Difficulty moving from lying on back to sitting on the side of the bed? : None Difficulty sitting down on and standing up from a chair with arms (e.g., wheelchair,  bedside commode, etc,.)?: A Little Help needed moving to and from a bed to chair (including a wheelchair)?: A Little Help needed walking in hospital room?: A Little Help needed climbing 3-5 steps with a railing? : A Little 6 Click Score: 20    End of Session Equipment Utilized During Treatment: Gait belt Activity Tolerance: Patient tolerated treatment well Patient left: with call bell/phone within reach;with nursing/sitter in room;with restraints reapplied;in chair;with chair alarm set(chair alarm belt) Nurse Communication: Mobility status PT Visit Diagnosis: Other abnormalities of gait and mobility (R26.89)     Time: 1329-1405 PT Time Calculation (min) (ACUTE ONLY): 36 min  Charges:  $Gait Training: 8-22 mins $Therapeutic Activity: 8-22 mins                    G Codes:       Earney Navy, PTA Pager: [phone]     Darliss Cheney 09/28/2017, 4:11 PM

## 2017-09-29 ENCOUNTER — Other Ambulatory Visit: Payer: Self-pay

## 2017-09-29 ENCOUNTER — Encounter (HOSPITAL_COMMUNITY): Payer: Self-pay

## 2017-09-29 DIAGNOSIS — R4182 Altered mental status, unspecified: Secondary | ICD-10-CM

## 2017-09-29 DIAGNOSIS — R451 Restlessness and agitation: Secondary | ICD-10-CM

## 2017-09-29 DIAGNOSIS — R569 Unspecified convulsions: Secondary | ICD-10-CM

## 2017-09-29 DIAGNOSIS — G939 Disorder of brain, unspecified: Secondary | ICD-10-CM

## 2017-09-29 DIAGNOSIS — Z515 Encounter for palliative care: Secondary | ICD-10-CM

## 2017-09-29 DIAGNOSIS — Z7189 Other specified counseling: Secondary | ICD-10-CM

## 2017-09-29 DIAGNOSIS — F0391 Unspecified dementia with behavioral disturbance: Secondary | ICD-10-CM

## 2017-09-29 MED ORDER — QUETIAPINE FUMARATE 25 MG PO TABS
25.0000 mg | ORAL_TABLET | Freq: Every day | ORAL | Status: DC
  Administered 2017-09-29 – 2017-09-30 (×2): 25 mg via ORAL
  Filled 2017-09-29 (×2): qty 1

## 2017-09-29 NOTE — Progress Notes (Signed)
PROGRESS NOTE    Jonathon Keller  DXA:[id number] DOB: July 04, 1940 DOA: 09/23/2017 PCP: [name], MD    Brief Narrative:  77 year old male who presented with left-sided weakness and altered mentation.  He does have the significant past medical medical history of dementia, depression and tobacco abuse. Patient was noted to be more confused over the last several days prior to hospitalization. On the day of admission he was noted to have left facial droop and left-sided weakness, associated with facial twitching. On his initial physical examination blood pressure 138/72, heart rate 80, respiratory rate 12, oxygen saturation 98%.  Moist mucous membranes, lungs clear to auscultation bilaterally, heart S1-S2 present rhythmic, the abdomen was soft nontender.  Patient was confused, disoriented, left facial droop.  Left arm strength 1 out of 5, left leg 3 out of 5.  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  Associated edema and mass-effect with 4 mm right left midline shift.  EKG sinus rhythm, left axis deviation, right bundle branch block.  Chest x-ray with increased lung markings bilaterally.  Patient was admitted to the hospital with working diagnosis of altered mentation due to newly diagnosed brain mass complicated with right to left midline shift.  Assessment & Plan:   Principal Problem:   Brain mass Active Problems:   Dementia   Left-sided weakness   Tobacco abuse   Acute metabolic encephalopathy   1.  2.9 cm well-circumscribed heterogeneous mass in the right frontal lobe, complicated by 4 mm right left midline shift. Patient is confused and disorientated, at times agitated. Persistent weakness on his left upper extremity. No apparent pain. Patient's family have decided not to proceed with neurosurgery due to patient's poor physical and mental baseline. Palliative care has been consulted and hospice services will be arranged. Will  continue with systemic steroids for now, will need to clarify with neurosurgery duration of steroid therapy. Continue prophylactic divalproex and levetiracetam.   2.  Dementia. Continue neuro checks per unit protocol, patient is at high risk for developing delirium. Sitter at the bedside. Will discontinue IV fluids for now. Added quetiapine at night.   3.  Tobacco abuse. Nicotine patch.    DVT prophylaxis: scd  Code Status: dnr Family Communication: no family at the bedside  Disposition Plan: hospice services   Consultants:   Neurosurgery   Procedures:     Antimicrobials:      Subjective: Patient confused, not agitated, denies any pain or dyspnea, no nausea or vomiting. Tolerating po well.   Objective: Vitals:   09/28/17 1400 09/28/17 1825 09/28/17 2016 09/29/17 1136  BP: (!) 162/85 (!) 146/59 140/74 (!) 104/42  Pulse: (!) 107 89 83 (!) 59  Resp: 18 18 18 18   Temp: 98.7 F (37.1 C) 98 F (36.7 C) 98.3 F (36.8 C) 97.8 F (36.6 C)  TempSrc: Oral Oral Oral Oral  SpO2: 99% 99% 98% 98%  Weight:      Height:        Intake/Output Summary (Last 24 hours) at 09/29/2017 1150 Last data filed at 09/29/2017 2094 Gross per 24 hour  Intake 755 ml  Output -  Net 755 ml   Filed Weights   09/23/17 2255  Weight: 71.8 kg (158 lb 4.6 oz)    Examination:   General: deconditioned and ill looking appearing Neurology: Awake, decreased strength on right upper extremity.1- 2/5.  E ENT: mild pallor, no icterus, oral mucosa moist Cardiovascular: No JVD. S1-S2 present, rhythmic, no  gallops, rubs, or murmurs. No lower extremity edema. Pulmonary: decreased breath sounds bilaterally at bases, adequate air movement, no wheezing, rhonchi or rales. Gastrointestinal. Abdomen with no organomegaly, non tender, no rebound or guarding Skin. No rashes Musculoskeletal: no joint deformities     Data Reviewed: I have personally reviewed following labs and imaging studies  CBC: Recent Labs    Lab Oct 17, 2017 1714 Oct 17, 2017 1716 09/24/17 0544  WBC  --  10.4 8.0  NEUTROABS  --  8.3*  --   HGB 14.6 13.9 14.3  HCT 43.0 41.7 42.5  MCV  --  92.9 92.6  PLT  --  356 272   Basic Metabolic Panel: Recent Labs  Lab 2017-10-17 1714 10-17-17 1716 09/24/17 0544  NA 139 139 137  K 4.7 4.8 4.2  CL 101 102 102  CO2  --  29 23  GLUCOSE 102* 103* 124*  BUN 18 14 16   CREATININE 1.10 1.14 0.99  CALCIUM  --  9.4 9.3   GFR: Estimated Creatinine Clearance: 62.5 mL/min (by C-G formula based on SCr of 0.99 mg/dL). Liver Function Tests: Recent Labs  Lab 2017-10-17 1716  AST 31  ALT 18  ALKPHOS 106  BILITOT 1.0  PROT 7.9  ALBUMIN 3.9   No results for input(s): LIPASE, AMYLASE in the last 168 hours. No results for input(s): AMMONIA in the last 168 hours. Coagulation Profile: Recent Labs  Lab 10/17/2017 1716  INR 1.12   Cardiac Enzymes: No results for input(s): CKTOTAL, CKMB, CKMBINDEX, TROPONINI in the last 168 hours. BNP (last 3 results) No results for input(s): PROBNP in the last 8760 hours. HbA1C: No results for input(s): HGBA1C in the last 72 hours. CBG: Recent Labs  Lab 09/24/17 1154 09/26/17 0628 09/27/17 0634 09/28/17 0606 09/28/17 0745  GLUCAP 132* 113* 87 90 109*   Lipid Profile: No results for input(s): CHOL, HDL, LDLCALC, TRIG, CHOLHDL, LDLDIRECT in the last 72 hours. Thyroid Function Tests: No results for input(s): TSH, T4TOTAL, FREET4, T3FREE, THYROIDAB in the last 72 hours. Anemia Panel: No results for input(s): VITAMINB12, FOLATE, FERRITIN, TIBC, IRON, RETICCTPCT in the last 72 hours.    Radiology Studies: I have reviewed all of the imaging during this hospital visit personally     Scheduled Meds: . dexamethasone  6 mg Intravenous Q6H  . divalproex  750 mg Oral Q12H  . heparin  5,000 Units Subcutaneous Q8H  . nicotine  21 mg Transdermal Daily  . QUEtiapine  25 mg Oral QHS   Continuous Infusions: . sodium chloride 75 mL/hr at 10/17/2017 2303  .  levETIRAcetam 1,000 mg (09/29/17 0903)     LOS: 6 days        Tawni Millers, MD [facility] Pager [phone]
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
## 2017-09-29 NOTE — Consult Note (Signed)
Consultation Note Date: 09/29/2017   Patient Name: Jonathon Keller  DOB: [birth year]  MRN: [id number]  Age / Sex: 77 y.o., male  PCP: [name], MD Referring Physician: Tawni Millers  Reason for Consultation: Establishing goals of care  HPI/Patient Profile: 77 y.o. male  with past medical history of dementia, depression, anxiety, tobacco abuse, eczema admitted on 09/23/2017 with altered mental status, left sided weakness and left facial droop/twitching. CT head revealed 2.9cm well-circumscribed heterogenous mass in right frontal lobe with edema and mass effect, 85m R>L midline shift. Neurology following. Started on decadron and keppra for concern of seizures. CT chest, abdomen, pelvis performed without primary source. MRI brain revealed edema with stable midline shift. Neurosurgery following for consideration of craniotomy for excision of right frontal mass. Repeat head CT stable. Sister/POA requesting palliative care consult prior to possible surgery.   Clinical Assessment and Goals of Care: I have reviewed medical records, discussed with care team, and met with sister ([name] and brother-in-law ([name] in conference room to discuss diagnosis, prognosis, [place], disposition and options.  Patient is very emotional this morning. He is crying and continuously yells "get [place]." Safety sitter at bedside.   Introduced Palliative Medicine as specialized medical care for people living with serious illness. It focuses on providing relief from the symptoms and stress of a serious illness. The goal is to improve quality of life for both the patient and the family.  We discussed a brief life review of the patient. LVaughan Bastashares an unfortunate history of [name] being estranged from his wife and children. He moved to [place] at age [age]to live with LVaughan Bastaand DWaunita Schooner who have been his primary support system  since. He was diagnosed with dementia many years ago. They were able to keep him in their home until November 2018, when dementia progression was evident. Since then, [facility]. Baseline, able to ambulate with minimal assist but requires assist and encouragement with ADL's. His cognitive status has continued to worsen.    Discussed hospital diagnoses and interventions. LVaughan Bastaand DWaunita Schoonerhave a good understanding of brain mass and the neurosurgery recommendation for surgery. LVaughan Bastaand DWaunita Schoonerhave decided against surgical intervention. LVaughan Bastatells me BYarethhas continuously stated "I want to die" and she does not feel right putting him through surgery and aggressive interventions when he keeps telling her he is "ready to die." We also discussed underlying dementia and the challenges with getting him back to pre-hospital baseline.   Advanced directives, concepts specific to code status, artifical feeding and hydration, and rehospitalization were considered and discussed. LVaughan Bastais documented HCPOA. Discussed MOST form--she wishes to focus on his comfort and dignity at EOL. She does not want to "prolong his suffering."   Introduced and completed MOST form. DNR/DNI, comfort measures, antibiotic time trial if indicated, no IVF, and no feeding tube.   The difference between aggressive medical intervention and comfort care was considered in light of the patient  and family goals of care. Educated on hospice philosophy including focus on symptom management and preventing re-hospitalization. [name] and [place] request hospice services on discharge, also understanding he will require higher level of care. I explained that social work will help with discharge plan.   We reviewed his medication list. Family agreeable to start low dose Seroquel to help with agitation/impulsivity. We also discussed continuing depakote for seizures/agitation.    Questions and concerns were  addressed.  Hard Choices booklet left for review. PMT contact information given. Therapeutic listening as family shares stories of [name] love for his routine including walking, smoking, and tinkering on cars. Emotional support provided.    SUMMARY OF RECOMMENDATIONS    MOST form completed with sister/HCPOA. DNR/DNI, comfort measures only, ABX for time trial if indicated, no IVF, and no feeding tube.   Sister declines surgical intervention for brain mass. The patient has continuously told her "I want to die." She wishes to focus on his comfort, quality, and dignity.   Shift towards comfort focused care.   Symptom management--see below.  Introduced palliative versus hospice options. Family requests hospice services on discharge, understanding hospice philosophy. Patient is from independent living facility and will require higher level of care on discharge. SW consulted to assist with disposition.   PMT will follow.   Code Status/Advance Care Planning:  DNR  Symptom Management:   Seroquel 98m PO HS  Recommend continuing Depakote on discharge. May help manage agitation.  Recommend continuing Decadron PO on discharge.    Palliative Prophylaxis:   Aspiration, Bowel Regimen, Delirium Protocol, Frequent Pain Assessment, Oral Care and Turn Reposition  Additional Recommendations (Limitations, Scope, Preferences):  DNR/DNI. Comfort focused care with goal of discharge with hospice services.   Psycho-social/Spiritual:   Desire for further Chaplaincy support:yes  Additional Recommendations: Caregiving  Support/Resources and Education on Hospice  Prognosis:   Unable to determine: likely less than 6 months and hospice eligible with right frontal brain mass (family opting against surgical intervention) and underlying progressive dementia.  Discharge Planning: To Be Determined      Primary Diagnoses: Present on Admission: . Dementia . Brain mass . Tobacco abuse . Acute  metabolic encephalopathy   I have reviewed the medical record, interviewed the patient and family, and examined the patient. The following aspects are pertinent.  Past Medical History:  Diagnosis Date  . Allergy   . Anxiety   . Cataract   . Dementia   . Depression   . Eczema   . Tobacco abuse    Social History   Socioeconomic History  . Marital status: Widowed    Spouse name: Not on file  . Number of children: Not on file  . Years of education: Not on file  . Highest education level: Not on file  Occupational History  . Not on file  Social Needs  . Financial resource strain: Not on file  . Food insecurity:    Worry: Not on file    Inability: Not on file  . Transportation needs:    Medical: Not on file    Non-medical: Not on file  Tobacco Use  . Smoking status: Current Every Day Smoker    Packs/day: 0.50  . Smokeless tobacco: Never Used  Substance and Sexual Activity  . Alcohol use: No  . Drug use: No  . Sexual activity: Not on file  Lifestyle  . Physical activity:    Days per week: Not on file    Minutes per session: Not on file  .  Stress: Not on file  Relationships  . Social connections:    Talks on phone: Not on file    Gets together: Not on file    Attends religious service: Not on file    Active member of club or organization: Not on file    Attends meetings of clubs or organizations: Not on file    Relationship status: Not on file  Other Topics Concern  . Not on file  Social History Narrative  . Not on file   Family History  Problem Relation Age of Onset  . Cancer Mother   . Heart disease Mother   . Cancer Father   . Stroke Father   . Cancer Sister   . Heart disease Brother   . Cancer Paternal Grandmother   . Cancer Paternal Grandfather    Scheduled Meds: . dexamethasone  6 mg Intravenous Q6H  . divalproex  750 mg Oral Q12H  . heparin  5,000 Units Subcutaneous Q8H  . nicotine  21 mg Transdermal Daily  . QUEtiapine  25 mg Oral QHS    Continuous Infusions: . sodium chloride 75 mL/hr at 09/23/17 2303  . levETIRAcetam 1,000 mg (09/29/17 0903)   PRN Meds:.acetaminophen **OR** acetaminophen, LORazepam, LORazepam, LORazepam, ondansetron **OR** ondansetron (ZOFRAN) IV, RESOURCE THICKENUP CLEAR Medications Prior to Admission:  Prior to Admission medications   Medication Sig Start Date End Date Taking? Authorizing Provider  HYDROcodone-acetaminophen (NORCO/VICODIN) 5-325 MG per tablet Take 1-2 tablets by mouth every 4 (four) hours as needed for moderate pain or severe pain. Patient not taking: Reported on 05/25/2014 05/14/14   Charlesetta Shanks, MD  ibuprofen (ADVIL,MOTRIN) 600 MG tablet Take 1 tablet (600 mg total) by mouth every 6 (six) hours as needed. Patient not taking: Reported on 05/25/2014 05/14/14   Charlesetta Shanks, MD  silver sulfADIAZINE (SILVADENE) 1 % cream Apply 1 application topically daily. Patient not taking: Reported on 09/23/2017 05/14/14   Charlesetta Shanks, MD  silver sulfADIAZINE (SILVADENE) 1 % cream Apply 1 application topically daily. Patient not taking: Reported on 09/23/2017 05/25/14   Roselee Culver, MD   No Known Allergies Review of Systems  Unable to perform ROS: Dementia   Physical Exam  Constitutional: He appears ill.  Pulmonary/Chest: No accessory muscle usage. No tachypnea. No respiratory distress.  Neurological: He is alert.  Disoriented  Skin: Skin is warm and dry.  Psychiatric: His speech is delayed. Cognition and memory are impaired.  Emotionally labile. Sad/crying during my visit.  Nursing note and vitals reviewed.  Vital Signs: BP (!) 104/42 (BP Location: Right Arm)   Pulse (!) 59   Temp 97.8 F (36.6 C) (Oral)   Resp 18   Ht 5' 9"  (1.753 m)   Wt 71.8 kg (158 lb 4.6 oz)   SpO2 98%   BMI 23.38 kg/m  Pain Scale: 0-10   Pain Score: 0-No pain  SpO2: SpO2: 98 % O2 Device:SpO2: 98 % O2 Flow Rate: .   IO: Intake/output summary:   Intake/Output Summary (Last 24 hours) at  09/29/2017 1150 Last data filed at 09/29/2017 0160 Gross per 24 hour  Intake 755 ml  Output -  Net 755 ml    LBM: Last BM Date: 09/25/17 Baseline Weight: Weight: 71.8 kg (158 lb 4.6 oz) Most recent weight: Weight: 71.8 kg (158 lb 4.6 oz)     Palliative Assessment/Data: PPS 40%   Flowsheet Rows     Most Recent Value  Intake Tab  Referral Department  Hospitalist  Unit at Time of  Referral  Med/Surg Unit  Palliative Care Primary Diagnosis  -- Lewanda Rife, brain tumor]  Palliative Care Type  New Palliative care  Reason for referral  Clarify Goals of Care  Date first seen by Palliative Care  09/29/17  Clinical Assessment  Palliative Performance Scale Score  40%  Psychosocial & Spiritual Assessment  Palliative Care Outcomes  Patient/Family meeting held?  Yes  Who was at the meeting?  sister and brother-in-law  [facility] goals of care, Counseled regarding hospice, Improved non-pain symptom therapy, Provided end of life care assistance, Provided psychosocial or spiritual support, ACP counseling assistance, Provided advance care planning, Transitioned to hospice      Time In: 1030 Time Out: 1145 Time Total:27mn Greater than 50%  of this time was spent counseling and coordinating care related to the above assessment and plan.  Signed by:  MIhor Dow FNP-C Palliative Medicine Team  Phone: [phone]: [phone]  Please contact Palliative Medicine Team phone at [phone] questions and concerns.  For individual provider: See AShea Evans

## 2017-09-29 NOTE — Progress Notes (Signed)
Patient ID: Jonathon Keller, male   DOB: 1941-02-15, 77 y.o.   MRN: [id number] Jonathon Keller  Will need steroids for life, along with anticonvulsants. I would recommend 4mg  qd. Keppra 500mg  po bid.

## 2017-09-30 LAB — GLUCOSE, CAPILLARY: GLUCOSE-CAPILLARY: 116 mg/dL — AB (ref 65–99)

## 2017-09-30 MED ORDER — LEVETIRACETAM 500 MG PO TABS
500.0000 mg | ORAL_TABLET | Freq: Two times a day (BID) | ORAL | Status: DC
  Administered 2017-09-30 – 2017-10-02 (×4): 500 mg via ORAL
  Filled 2017-09-30 (×4): qty 1

## 2017-09-30 MED ORDER — LEVETIRACETAM 500 MG PO TABS: 500.0000 mg | ORAL_TABLET | Freq: Two times a day (BID) | ORAL | Status: DC

## 2017-09-30 MED ORDER — DEXAMETHASONE 4 MG PO TABS
4.0000 mg | ORAL_TABLET | Freq: Every day | ORAL | Status: DC
  Administered 2017-10-01 – 2017-10-02 (×2): 4 mg via ORAL
  Filled 2017-09-30 (×2): qty 1

## 2017-09-30 NOTE — ED Notes (Signed)
1 mg Ativan wasted with Gibson Ramp RN

## 2017-09-30 NOTE — Progress Notes (Addendum)
1915 Bedside shift report, pt resting in bed, mittens to bilateral hands, sitter at bedside. Pt denies pain. NAD, fall precautions in place, [facility].  2030 Pt assessed, see flow sheet. Pt oriented to name and birthday. Reoriented to place and situation. Pt denies pain, SOB, hungry and thirst. Pt soiled, bath given and full linen changed. IV unwrapped on left forearm and catheter occluded. IV removed. Fall precautions in place, [facility].   2115 Pt medicated per MAR, pt c/o leg pain, tylenol given along with PM meds. Pt tolerated well, crushed in pudding. No needs at this time.

## 2017-09-30 NOTE — Progress Notes (Addendum)
PROGRESS NOTE    Jonathon Keller  BVQ:[id number] DOB: [birth year] DOA: 09/23/2017 PCP: Jonathon Rasmussen, MD    Brief Narrative:  77 year old male who presented with left-sided weakness and altered mentation.  He does have the significant past medical medical history of dementia, depression and tobacco abuse. Patient was noted to be more confused over the last several days prior to hospitalization. On the day of admission he was noted to have left facial droop and left-sided weakness, associated with facial twitching. On his initial physical examination blood pressure 138/72, heart rate 80, respiratory rate 12, oxygen saturation 98%.  Moist mucous membranes, lungs clear to auscultation bilaterally, heart S1-S2 present rhythmic, the abdomen was soft nontender.  Patient was confused, disoriented, left facial droop.  Left arm strength 1 out of 5, left leg 3 out of 5.  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  Associated edema and mass-effect with 4 mm right left midline shift.  EKG sinus rhythm, left axis deviation, right bundle branch block.  Chest x-ray with increased lung markings bilaterally.  Patient was admitted to the hospital with working diagnosis of altered mentation due to newly diagnosed brain mass complicated with right to left midline shift.   Assessment & Plan:   Principal Problem:   Brain mass Active Problems:   Dementia   Left-sided weakness   Tobacco abuse   Acute metabolic encephalopathy   Palliative care by specialist   Goals of care, counseling/discussion   Agitation   Altered mental status  1.  2.9 cm well-circumscribed heterogeneous mass in the right frontal lobe, complicated by 4 mm right left midline shift. Will continue systemic steroids with decadron 4 mg daily and prophylactic anti-seizure regimen with keppra. Social services have been consulted for hospice arrangements. Patient is DNR. Discontinue q2 neuro  checks and telemetry monitoring.   2.  Dementia. Sitter at bedside, will continue fall and aspiration preacautions. Tolerating well quetiapine at night.   3.  Tobacco abuse. Nicotine patch, tolerating well.     DVT prophylaxis: scd  Code Status: dnr Family Communication: I spoke with patient's sister at the bedside and all questions were addressed.  Disposition Plan: hospice services   Consultants:   Neurosurgery   Procedures:     Antimicrobials:     Subjective: Patient with no apparent pain, tolerating po well, positive for confusion. All information obtained from patient's family at the bedside and nursing.   Objective: Vitals:   09/30/17 0312 09/30/17 0847 09/30/17 1314 09/30/17 1610  BP: (!) 114/59 104/79 136/60 140/69  Pulse: 64 92 90 89  Resp: 18 20 18 20   Temp: 97.6 F (36.4 C) 98.5 F (36.9 C) 98.2 F (36.8 C) 98.6 F (37 C)  TempSrc: Oral Oral Oral Oral  SpO2: 93% 93% 99% 97%  Weight:      Height:        Intake/Output Summary (Last 24 hours) at 09/30/2017 1642 Last data filed at 09/30/2017 1230 Gross per 24 hour  Intake 520 ml  Output -  Net 520 ml   Filed Weights   09/23/17 2255  Weight: 71.8 kg (158 lb 4.6 oz)    Examination:   General: Not in pain or dyspnea. Deconditioned.  Neurology: Awake, confused, follows commands, non focal.  E ENT: no pallor, no icterus, oral mucosa moist Cardiovascular: No JVD. S1-S2 present, rhythmic, no gallops, rubs, or murmurs. No lower extremity edema. Pulmonary: vesicular breath sounds bilaterally, adequate air movement, no  wheezing, rhonchi or rales. Gastrointestinal. Abdomen with no organomegaly, non tender, no rebound or guarding Skin. No rashes Musculoskeletal: no joint deformities     Data Reviewed: I have personally reviewed following labs and imaging studies  CBC: Recent Labs  Lab 09/23/17 1714 09/23/17 1716 09/24/17 0544  WBC  --  10.4 8.0  NEUTROABS  --  8.3*  --   HGB 14.6  13.9 14.3  HCT 43.0 41.7 42.5  MCV  --  92.9 92.6  PLT  --  356 262   Basic Metabolic Panel: Recent Labs  Lab 09/23/17 1714 09/23/17 1716 09/24/17 0544  NA 139 139 137  K 4.7 4.8 4.2  CL 101 102 102  CO2  --  29 23  GLUCOSE 102* 103* 124*  BUN 18 14 16   CREATININE 1.10 1.14 0.99  CALCIUM  --  9.4 9.3   GFR: Estimated Creatinine Clearance: 61.5 mL/min (by C-G formula based on SCr of 0.99 mg/dL). Liver Function Tests: Recent Labs  Lab 09/23/17 1716  AST 31  ALT 18  ALKPHOS 106  BILITOT 1.0  PROT 7.9  ALBUMIN 3.9   No results for input(s): LIPASE, AMYLASE in the last 168 hours. No results for input(s): AMMONIA in the last 168 hours. Coagulation Profile: Recent Labs  Lab 09/23/17 1716  INR 1.12   Cardiac Enzymes: No results for input(s): CKTOTAL, CKMB, CKMBINDEX, TROPONINI in the last 168 hours. BNP (last 3 results) No results for input(s): PROBNP in the last 8760 hours. HbA1C: No results for input(s): HGBA1C in the last 72 hours. CBG: Recent Labs  Lab 09/26/17 0628 09/27/17 0634 09/28/17 0606 09/28/17 0745 09/30/17 0657  GLUCAP 113* 87 90 109* 116*   Lipid Profile: No results for input(s): CHOL, HDL, LDLCALC, TRIG, CHOLHDL, LDLDIRECT in the last 72 hours. Thyroid Function Tests: No results for input(s): TSH, T4TOTAL, FREET4, T3FREE, THYROIDAB in the last 72 hours. Anemia Panel: No results for input(s): VITAMINB12, FOLATE, FERRITIN, TIBC, IRON, RETICCTPCT in the last 72 hours.    Radiology Studies: I have reviewed all of the imaging during this hospital visit personally     Scheduled Meds: . dexamethasone  4 mg Oral Daily  . levETIRAcetam  500 mg Oral BID  . nicotine  21 mg Transdermal Daily  . QUEtiapine  25 mg Oral QHS   Continuous Infusions:   LOS: 7 days        Jonathon Cortez Gerome Apley, MD [facility] Pager [phone]

## 2017-09-30 NOTE — Consult Note (Addendum)
            [facility] CM Primary Care Navigator  09/30/2017  Jonathon Keller [birth year] [id number]   Went to see patient at the bedside to identify possible discharge needs but he is asleep- resting with eyes closed. Staff reports that patient is confused/ disoriented and agitated at times. No family member noted in the room at this time.  Per chart review, patient was admitted to the hospital with working diagnosis of altered mentation due to newly diagnosed brain mass, family decided not to proceed with neurosurgery due to patient's poor physical and mental baseline.  Per MD note, palliative care team was consulted and following. Social work services have been consulted for hospice arrangements.  Disposition for discharge is Hospice services per MD note (Inpatient hospice care).  Patient transitioned to comfort focused care per palliative.  Noted no further health management needs identified at this point.   For additional questions please contact:  Edwena Felty A. Yissel Habermehl, BSN, RN-BC [facility] PRIMARY CARE Navigator Cell: [phone]

## 2017-09-30 NOTE — Progress Notes (Signed)
Daily Progress Note   Patient Name: Jonathon Keller       Date: 09/30/2017 DOB: 1941-04-14  Age: 77 y.o. MRN#: [id number] Attending Physician: Tawni Millers Primary Care Physician: [name], MD Admit Date: 09/23/2017  Reason for Consultation/Follow-up: Establishing goals of care  Subjective: Patient awake, alert, oriented to person and place. Appears comfortable this afternoon. [birth year] birthday. Sitter at bedside updates me that he slept well last night.   Sister, [name], at bedside. She is relieved that he is much more calm today compared to yesterday. She has not heard from SW yet about discharge planning. She is still interested in SNF with hospice services if possible, understanding he will be unable to care for himself at previous independent living facility. Again discussed focus on comfort and quality. Also continuing medications for agitation as well as decadron and keppra on discharge.   Answered questions and concerns. [name] has PMT contact information.   Length of Stay: 7  Current Medications: Scheduled Meds:  . dexamethasone  6 mg Intravenous Q6H  . divalproex  750 mg Oral Q12H  . heparin  5,000 Units Subcutaneous Q8H  . nicotine  21 mg Transdermal Daily  . QUEtiapine  25 mg Oral QHS    Continuous Infusions: . levETIRAcetam 1,000 mg (09/30/17 1019)    PRN Meds: acetaminophen **OR** acetaminophen, LORazepam, LORazepam, LORazepam, ondansetron **OR** ondansetron (ZOFRAN) IV, RESOURCE THICKENUP CLEAR  Physical Exam  Constitutional: He is cooperative.  HENT:  Head: Normocephalic and atraumatic.  Pulmonary/Chest: No accessory muscle usage. No tachypnea. No respiratory distress.  Abdominal: Normal appearance.  Neurological: He is alert.  Pleasantly  confused  Skin: Skin is warm and dry.  Psychiatric: He is inattentive.  Nursing note and vitals reviewed.          Vital Signs: BP 104/79 (BP Location: Right Arm)   Pulse 92   Temp 98.5 F (36.9 C) (Oral)   Resp 20   Ht 5\' 9"  (1.753 m)   Wt 71.8 kg (158 lb 4.6 oz)   SpO2 93%   BMI 23.38 kg/m  SpO2: SpO2: 93 % O2 Device: O2 Device: Room Air O2 Flow Rate:    Intake/output summary:   Intake/Output Summary (Last 24 hours) at 09/30/2017 1124 Last data filed at 09/30/2017 0800 Gross per 24 hour  Intake  645 ml  Output -  Net 645 ml   LBM: Last BM Date: 09/26/17 Baseline Weight: Weight: 71.8 kg (158 lb 4.6 oz) Most recent weight: Weight: 71.8 kg (158 lb 4.6 oz)       Palliative Assessment/Data: PPS 40%   Flowsheet Rows     Most Recent Value  Intake Tab  Referral Department  Hospitalist  Unit at Time of Referral  Med/Surg Unit  Palliative Care Primary Diagnosis  -- Lewanda Rife, brain tumor]  Date Notified  09/28/17  Palliative Care Type  New Palliative care  Reason for referral  Clarify Goals of Care  Date of Admission  09/23/17  Date first seen by Palliative Care  09/29/17  # of days Palliative referral response time  1 Day(s)  # of days IP prior to Palliative referral  5  Clinical Assessment  Palliative Performance Scale Score  40%  Psychosocial & Spiritual Assessment  Palliative Care Outcomes  Patient/Family meeting held?  Yes  Who was at the meeting?  sister and brother-in-law  Palliative Care Outcomes  Clarified goals of care, Counseled regarding hospice, Improved non-pain symptom therapy, Provided end of life care assistance, Provided psychosocial or spiritual support, ACP counseling assistance, Provided advance care planning, Transitioned to hospice      Patient Active Problem List   Diagnosis Date Noted  . Palliative care by specialist   . Goals of care, counseling/discussion   . Agitation   . Altered mental status   . Dementia 09/23/2017  . Left-sided  weakness 09/23/2017  . Brain mass 09/23/2017  . Tobacco abuse   . Eczema   . Acute metabolic encephalopathy     Palliative Care Assessment & Plan   Patient Profile: 77 y.o. male  with past medical history of dementia, depression, anxiety, tobacco abuse, eczema admitted on 09/23/2017 with altered mental status, left sided weakness and left facial droop/twitching. CT head revealed 2.9cm well-circumscribed heterogenous mass in right frontal lobe with edema and mass effect, 83mm R>L midline shift. Neurology following. Started on decadron and keppra for concern of seizures. CT chest, abdomen, pelvis performed without primary source. MRI brain revealed edema with stable midline shift. Neurosurgery following for consideration of craniotomy for excision of right frontal mass. Repeat head CT stable. Sister/POA requesting palliative care consult prior to possible surgery.   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  MOST form completed and in chart including goal for comfort focused care on discharge.   Continue Seroquel HS.   Sister requesting hospice services on discharge. Patient is from [place] and will require higher level of care. SW consult pending.   Code Status: DNR/DNI   Code Status Orders  (From admission, onward)        Start     Ordered   09/23/17 2046  Do not attempt resuscitation (DNR)  Continuous    Question Answer Comment  In the event of cardiac or respiratory ARREST Do not call a "code blue"   In the event of cardiac or respiratory ARREST Do not perform Intubation, CPR, defibrillation or ACLS   In the event of cardiac or respiratory ARREST Use medication by any route, position, wound care, and other measures to relive pain and suffering. May use oxygen, suction and manual treatment of airway obstruction as  needed for comfort.      09/23/17 2047    Code Status History  This patient has a current code status but no historical code status.       Prognosis:   Unable to determine: likely less than 6 months and hospice eligible with right frontal lobe mass (family opting against surgical interventions) and underlying dementia.  Discharge Planning:  To Be Determined  Care plan was discussed with patient, sitter at bedside, sister  Thank you for allowing the Palliative Medicine Team to assist in the care of this patient.   Time In: 1020- 1350 Time Out: 1030 1410 Total Time 37min Prolonged Time Billed  no      Greater than 50%  of this time was spent counseling and coordinating care related to the above assessment and plan.  Ihor Dow, FNP-C Palliative Medicine Team  Phone: [phone] Fax: [phone]  Please contact Palliative Medicine Team phone at [phone] for questions and concerns.

## 2017-10-01 LAB — GLUCOSE, CAPILLARY: Glucose-Capillary: 94 mg/dL (ref 65–99)

## 2017-10-01 MED ORDER — LORAZEPAM 2 MG/ML PO CONC: 1.0000 mg | Freq: Four times a day (QID) | ORAL | Status: DC | PRN

## 2017-10-01 MED ORDER — LORAZEPAM 1 MG PO TABS: 1.0000 mg | ORAL_TABLET | Freq: Four times a day (QID) | ORAL | Status: DC | PRN

## 2017-10-01 MED ORDER — OLANZAPINE 5 MG PO TBDP
10.0000 mg | ORAL_TABLET | Freq: Every day | ORAL | Status: DC
  Administered 2017-10-01: 10 mg via ORAL
  Filled 2017-10-01: qty 2

## 2017-10-01 MED ORDER — LORAZEPAM 2 MG/ML IJ SOLN
1.0000 mg | Freq: Four times a day (QID) | INTRAMUSCULAR | Status: DC | PRN
  Administered 2017-10-02: 1 mg via INTRAMUSCULAR
  Filled 2017-10-01: qty 1

## 2017-10-01 NOTE — Progress Notes (Signed)
PROGRESS NOTE    Jonathon Keller  YQM:[id number] DOB: Aug 10, 1940 DOA: 09/23/2017 PCP: [name], MD    Brief Narrative:  77 year old male who presented with left-sided weakness and altered mentation. He does have the significant past medicalmedical history of dementia,depressionand tobacco abuse. Patient was noted to be more confused over the last several days prior to hospitalization. On the day of admission he was noted to have left facial droop and left-sided weakness, associated with facial twitching. On his initial physical examination blood pressure 138/72,heart rate80,respiratory rate 12,oxygen saturation 98%.Moist mucous membranes, lungs clear to auscultation bilaterally, heart S1-S2 present rhythmic, the abdomen was soft nontender. Patient was confused, disoriented, left facial droop. 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 Associated edema and mass-effect with 4 mm right left midline shift.EKG sinus rhythm, left axis deviation, right bundle branch block.Chest x-ray with increased lung markings bilaterally.  Patient was admitted to the hospital with working diagnosis of altered mentation due to newly diagnosed brain mass complicated with right to left midline shift.   Assessment & Plan:   Principal Problem:   Brain mass Active Problems:   Dementia   Left-sided weakness   Tobacco abuse   Acute metabolic encephalopathy   Palliative care by specialist   Goals of care, counseling/discussion   Agitation   Altered mental status   1.2.9 cm well-circumscribed heterogeneous mass in the right frontal lobe, complicated by 4 mm right left midline shift.Steroids with decadron 4 mg daily and prophylactic anti-seizure regimen with keppra per neurosurgery recommendations. Pending placement at hospice facility.   2.Dementia. Sitter at bedside, fall and  aspiration preacautions. On quetiapine at night, with improvement in mentation.   3. Dysphagia. Speech evaluation, to continue dysphagia one with aspiration precautions, tolerating well.   3.Tobacco abuse. Continue Nicotine patch, tolerating well.    DVT prophylaxis:scd Code Status:dnr Family Communication:I spoke with patient's sister at the bedside over the phone and all questions were addressed.  Disposition Plan:hospice unit.   Consultants:  Neurosurgery  Procedures:    Antimicrobials:     Subjective: Patient has been more calm, less agitated, no nausea or vomiting, no dyspnea or chest pain.   Objective: Vitals:   09/30/17 1314 09/30/17 1610 09/30/17 2037 10/01/17 0926  BP: 136/60 140/69 (!) 166/88 (!) 150/69  Pulse: 90 89 96 78  Resp: 18 20 18 17   Temp: 98.2 F (36.8 C) 98.6 F (37 C) 98 F (36.7 C) 98 F (36.7 C)  TempSrc: Oral Oral Oral Oral  SpO2: 99% 97% 94% 98%  Weight:      Height:        Intake/Output Summary (Last 24 hours) at 10/01/2017 1143 Last data filed at 09/30/2017 2200 Gross per 24 hour  Intake 470 ml  Output -  Net 470 ml   Filed Weights   09/23/17 2255  Weight: 71.8 kg (158 lb 4.6 oz)    Examination:   General: Not in pain or dyspnea, deconditioned Neurology: Awake.   E ENT: no pallor, no icterus, oral mucosa moist Cardiovascular: No JVD. S1-S2 present, rhythmic, no gallops, rubs, or murmurs. No lower extremity edema. Pulmonary: vesicular breath sounds bilaterally, adequate air movement, no wheezing, rhonchi or rales. Gastrointestinal. Abdomen flat, no organomegaly, non tender, no rebound or guarding Skin. No rashes Musculoskeletal: no joint deformities     Data Reviewed: I have personally reviewed following labs and imaging studies  CBC: No results for input(s): WBC, NEUTROABS, HGB, HCT, MCV, PLT in the last 168 hours. Basic Metabolic Panel: No results for input(s): NA, K, CL, CO2, GLUCOSE, BUN,  CREATININE, CALCIUM, MG, PHOS in the last 168 hours. GFR: Estimated Creatinine Clearance: 61.5 mL/min (by C-G formula based on SCr of 0.99 mg/dL). Liver Function Tests: No results for input(s): AST, ALT, ALKPHOS, BILITOT, PROT, ALBUMIN in the last 168 hours. No results for input(s): LIPASE, AMYLASE in the last 168 hours. No results for input(s): AMMONIA in the last 168 hours. Coagulation Profile: No results for input(s): INR, PROTIME in the last 168 hours. Cardiac Enzymes: No results for input(s): CKTOTAL, CKMB, CKMBINDEX, TROPONINI in the last 168 hours. BNP (last 3 results) No results for input(s): PROBNP in the last 8760 hours. HbA1C: No results for input(s): HGBA1C in the last 72 hours. CBG: Recent Labs  Lab 09/27/17 0634 09/28/17 0606 09/28/17 0745 09/30/17 0657 10/01/17 0637  GLUCAP 87 90 109* 116* 94   Lipid Profile: No results for input(s): CHOL, HDL, LDLCALC, TRIG, CHOLHDL, LDLDIRECT in the last 72 hours. Thyroid Function Tests: No results for input(s): TSH, T4TOTAL, FREET4, T3FREE, THYROIDAB in the last 72 hours. Anemia Panel: No results for input(s): VITAMINB12, FOLATE, FERRITIN, TIBC, IRON, RETICCTPCT in the last 72 hours.    Radiology Studies: I have reviewed all of the imaging during this hospital visit personally     Scheduled Meds: . dexamethasone  4 mg Oral Daily  . levETIRAcetam  500 mg Oral BID  . nicotine  21 mg Transdermal Daily  . QUEtiapine  25 mg Oral QHS   Continuous Infusions:   LOS: 8 days        Mauricio Gerome Apley, MD [facility] Pager [phone]

## 2017-10-01 NOTE — Progress Notes (Addendum)
Palliative Medicine RN Note: Follow up on disposition after discussion with SW Jonathon Keller.   Patient continues to have out of control agitation requiring continuous tele-sitters. I visited him, and he was angry, demanding to go home, and unwilling to answer my questions about eating or pain. He requires mats on the floors and padding on his side rails. Jonathon Keller pulled out his IV last night, and he has no prn medications for agitation. After I saw him, he completely disrobed and had to be assisted into a gown.   This is directly related to his disease, although it is difficult to tell which part, as he has a mass, mass effect, cerebral edema, and midline shift, as well as underlying dementia, and terminal delirium cannot be ruled out. It is also possible that this agitation is indicative that he is transitioning to end of life. Discussed with PMT NP Jonathon Keller, and she adjusted his medications for agitation. She changed the Seroquel to Zyprexa, and she added Ativan prn (this should help with seizure suppression as well); we will monitor for side effects and efficacy of Ativan, as we may need to schedule it. It is feared that in order to control his symptoms, Jonathon Keller will require sedation to the point that he will become highly sedated, and he may no longer eat, which would greatly shorten his prognosis. He currently has a foley, and he is at very high risk of pulling it out.  I spoke with a liason for [facility] in [facility] about this complicated patient. Jonathon Keller will have the chart reviewed, and they may be able to accommodate his challenging symptoms. They are concerned about his sister's comfort level with likely sedation.  I called his sister Jonathon Keller. We spoke at length about his symptoms, as well as the goals she has for him. She reports that he told her days ago he is ready to die. He has always been quietly strong and independent, and she fears it is this strength that is preventing him from letting go. He goal  is comfort for Jonathon Keller, and she wants for him whatever it takes to get him peaceful and to have a dignified end of life. She states he would not want to be naked in front of people, and this would be distressing for him if he were able to understand. To this end, she would like him to be placed at inpatient hospice if at all possible, as his symptoms are not currently controlled, and the agitation and anger are distressing to her and to Jonathon Keller.  I called the [facility] back & left a message, as it is now after hours. I will be working tomorrow, and I have a planned follow up phone call to his sister at 38 tomorrow, and SW Jonathon Keller is aware of these conversations.  Jonathon Skiff Jeaneen Cala, RN, BSN, [facility] Palliative Medicine Team 10/01/2017 3:58 PM Office [phone]

## 2017-10-01 NOTE — Progress Notes (Signed)
Paged MD arrien : about patient [id number] Jonathon Keller,BRU he removed his IV night shift left IV out just wanted to send FYI and if you can place order? thanks

## 2017-10-01 NOTE — Progress Notes (Signed)
CSW following for discharge plan. CSW has faxed out referral for patient, but there are barriers to patient receiving bed offers for discharge to SNF with hospice. Patient continues to require sitters and mitts for safety, due to agitation; SNF is not able to handle that level of care, will need to be out of restraints and sitter-free for a 24 hour period. CSW discussed with RN earlier today about trying to DC the sitter, but patient is not ready at this time; still requires sitter at this time.   CSW checked in about insurance coverage for SNF with Hospice, because [facility] will not pay for long term SNF placement with Hospice services. [employer] Medicare will only cover SNF as long as the patient is participating in rehab services, if the insurance will cover rehabilitation services because the patient does not have a good prognosis.   CSW spoke with PMT RN to discuss disposition difficulties and options available. CSW will continue to follow.  Patient has manual review PASRR, has not received PASRR number at this time. Patient will need PASRR before he can be placed in SNF.  Laveda Abbe, [place] Clinical Social Worker [phone]

## 2017-10-01 NOTE — Plan of Care (Signed)
  Problem: Education: Goal: Knowledge of General Education information will improve Outcome: Progressing   Problem: Health Behavior/Discharge Planning: Goal: Ability to manage health-related needs will improve Outcome: Progressing   Problem: Clinical Measurements: Goal: Ability to maintain clinical measurements within normal limits will improve Outcome: Progressing Goal: Will remain free from infection Outcome: Progressing Goal: Diagnostic test results will improve Outcome: Progressing Goal: Respiratory complications will improve Outcome: Progressing Goal: Cardiovascular complication will be avoided Outcome: Progressing   Problem: Activity: Goal: Risk for activity intolerance will decrease Outcome: Progressing   Problem: Nutrition: Goal: Adequate nutrition will be maintained Outcome: Progressing   Problem: Coping: Goal: Level of anxiety will decrease Outcome: Progressing   Problem: Elimination: Goal: Will not experience complications related to bowel motility Outcome: Progressing Goal: Will not experience complications related to urinary retention Outcome: Progressing   Problem: Pain Managment: Goal: General experience of comfort will improve Outcome: Progressing   Problem: Safety: Goal: Ability to remain free from injury will improve Outcome: Progressing   Problem: Skin Integrity: Goal: Risk for impaired skin integrity will decrease Outcome: Progressing   Problem: Education: Goal: Knowledge of disease or condition will improve Outcome: Progressing Goal: Knowledge of secondary prevention will improve Outcome: Progressing Goal: Knowledge of patient specific risk factors addressed and post discharge goals established will improve Outcome: Progressing   Problem: Problem: Skin/Wound Progression Goal: Risk for impaired skin integrity will decrease Outcome: Progressing

## 2017-10-01 NOTE — Progress Notes (Signed)
Physical Therapy Treatment Patient Details Name: Jonathon Keller MRN: [id number] DOB: 11/08/1940 Today's Date: 10/01/2017    History of Present Illness Pt is a 77 y/o male admitted 09/23/17 secondary to L sided weakness and confusion beyond baseline. 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 Neurosurgery has deemed unlikely surgical candidate; on seizure precautions. Palliative following. PMH includes dementia, tobacco use.   PT Comments    OOB mobility deferred this session secondary to pt's current agitation level and attempting to leave hospital. Remains disoriented to situation, stating he wants to get home to his mom and dad. Restless with bed mobility. Able to problem solve through re-donning his gown with 75% cuing. Follows one-step commands ~50% of session. R-side gaze preference, but able to scan to L with verbal cues; L-side inattention, particularly LUE, not following any commands for purposeful movement. Will continue to follow acutely to maximize functional mobility and assist d/c plans.   Follow Up Recommendations  SNF;Supervision/Assistance - 24 hour (versus Hospice)     Equipment Recommendations  None recommended by PT    Recommendations for Other Services       Precautions / Restrictions Precautions Precautions: Fall Precaution Comments: Currently out of mitts/restraints (although still agitated, attempting to get OOB) Restrictions Weight Bearing Restrictions: No    Mobility  Bed Mobility Overal bed mobility: Needs Assistance Bed Mobility: Supine to Sit;Rolling Rolling: Supervision   Supine to sit: Supervision     General bed mobility comments: Supervision for safety; cues for sequencing to scoot up in bed, as pt attempting to scoot down further even though legs hanging off end of bed  Transfers                 General transfer comment: Deferred secondary to pt's current agitation level and actively  attempting to leave hospital  Ambulation/Gait                 Stairs             Wheelchair Mobility    Modified Rankin (Stroke Patients Only)       Balance Overall balance assessment: Needs assistance Sitting-balance support: Feet supported Sitting balance-Leahy Scale: Good Sitting balance - Comments: Good balance long sitting in bed performing reaching tasks with RUE                                     Cognition Arousal/Alertness: Awake/alert Behavior During Therapy: Restless;Impulsive Overall Cognitive Status: No family/caregiver present to determine baseline cognitive functioning Area of Impairment: Orientation;Memory;Following commands;Safety/judgement;Problem solving;Attention;Awareness                 Orientation Level: Disoriented to;Place;Time;Situation Current Attention Level: Focused Memory: Decreased short-term memory Following Commands: Follows one step commands with increased time;Follows one step commands inconsistently Safety/Judgement: Decreased awareness of safety;Decreased awareness of deficits Awareness: Intellectual Problem Solving: Difficulty sequencing;Requires verbal cues General Comments: Pt continues to have R-side gaze preference, but able to scan across midline with gaze upon verbal cue. Inattention to LUE, not following any commands to use it; other than that, following one step commands ~50% of session. Restless, attempting to take off/put on gown; able to problem solve donning gown with ~75% verbal/visual cuing      Exercises      General Comments        Pertinent Vitals/Pain Pain Assessment: No/denies pain  Home Living                      Prior Function            PT Goals (current goals can now be found in the care plan section) Acute Rehab PT Goals Patient Stated Goal: Return home to parents PT Goal Formulation: With patient Time For Goal Achievement: 10/09/17 Potential to  Achieve Goals: Fair Progress towards PT goals: Progressing toward goals    Frequency    Min 3X/week      PT Plan Current plan remains appropriate    Co-evaluation              AM-PAC PT "6 Clicks" Daily Activity  Outcome Measure  Difficulty turning over in bed (including adjusting bedclothes, sheets and blankets)?: A Little Difficulty moving from lying on back to sitting on the side of the bed? : A Little Difficulty sitting down on and standing up from a chair with arms (e.g., wheelchair, bedside commode, etc,.)?: A Little Help needed moving to and from a bed to chair (including a wheelchair)?: A Little Help needed walking in hospital room?: A Little Help needed climbing 3-5 steps with a railing? : A Little 6 Click Score: 18    End of Session   Activity Tolerance: Treatment limited secondary to agitation Patient left: in bed;with call bell/phone within reach;with bed alarm set Nurse Communication: Mobility status PT Visit Diagnosis: Other abnormalities of gait and mobility (R26.89);Other symptoms and signs involving the nervous system (R29.898)     Time: 6754-4920 PT Time Calculation (min) (ACUTE ONLY): 12 min  Charges:  $Neuromuscular Re-education: 8-22 mins                    G Codes:      Mabeline Caras, PT, DPT Acute Rehab Services  Pager: [place] 10/01/2017, 4:59 PM

## 2017-10-01 NOTE — Progress Notes (Signed)
Patient is for [facility]; message sent to Stevphen Rochester ; Mindi Slicker [facility] [phone]

## 2017-10-01 NOTE — Care Management Important Message (Signed)
Important Message  Patient Details  Name: Jonathon Keller MRN: [id number] Date of Birth: 09/22/1940   Medicare Important Message Given:  Yes    [name] P Huston Stonehocker 10/01/2017, 1:20 PM

## 2017-10-01 NOTE — Progress Notes (Signed)
  Speech Language Pathology Treatment: Dysphagia  Patient Details Name: Jonathon Keller MRN: [id number] DOB: 23-Feb-1941 Today's Date: 10/01/2017 Time: 1120-1140 SLP Time Calculation (min) (ACUTE ONLY): 20 min  Assessment / Plan / Recommendation Clinical Impression  Pt with improved alertness, islands of clear communication, expressing wants/needs.  Given change in course to comfort care, trialed pt with thin liquids today.  He demonstrated improved clinical performance, with no overt s/s of aspiration, and expressed pleasure at drinking regular water and coffee. He does have hx of silent aspiration - discussed situation with his sister, [name], over the phone, who agreed to liberalize liquids to allow pt to drink per his preferences.  She prefers he remains on purees for safety and states he enjoys current dysphagia 1 diet.  D/W Dr. Cathlean Sauer.  No further SLP warranted.  Diet orders changed to dys 1, thin liquids; give meds whole with puree.    HPI HPI: 77 yo male adm to [facility] with AMS, confusion and left sided weakness.  Pt CT chest showed bilateral ATX 09/23/17.  PMH + for anxiety, depression, tobacco use. 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  He is on seizure precautions and neuro surgery has been consulted.       SLP Plan  Discharge SLP treatment due to change to comfort status.         Recommendations  Diet recommendations: Dysphagia 1 (puree);Thin liquid Liquids provided via: Cup Medication Administration: Whole meds with puree Supervision: Full supervision/cueing for compensatory strategies Compensations: Minimize environmental distractions Postural Changes and/or Swallow Maneuvers: Out of bed for meals                Oral Care Recommendations: Oral care BID Follow up Recommendations: None SLP Visit Diagnosis: Dysphagia, oropharyngeal phase (R13.12) Plan: Discharge SLP treatment due to (comment)       GO                Juan Quam  Laurice 10/01/2017, 11:56 AM

## 2017-10-02 LAB — GLUCOSE, CAPILLARY: GLUCOSE-CAPILLARY: 100 mg/dL — AB (ref 65–99)

## 2017-10-02 MED ORDER — NICOTINE 21 MG/24HR TD PT24: 21.0000 mg | MEDICATED_PATCH | Freq: Every day | TRANSDERMAL | 0 refills | Status: AC

## 2017-10-02 MED ORDER — OLANZAPINE 10 MG PO TBDP: 10.0000 mg | ORAL_TABLET | Freq: Every day | ORAL | 0 refills | Status: AC

## 2017-10-02 MED ORDER — DEXAMETHASONE 4 MG PO TABS: 4.0000 mg | ORAL_TABLET | Freq: Every day | ORAL | 0 refills | Status: AC

## 2017-10-02 MED ORDER — LEVETIRACETAM 500 MG PO TABS: 500.0000 mg | ORAL_TABLET | Freq: Two times a day (BID) | ORAL | 0 refills | Status: AC

## 2017-10-02 NOTE — Progress Notes (Addendum)
Palliative Medicine RN Note: Rec'd a call from Cheri with [facility]. They are meeting with Mr Blanchfield sister this morning with expectation of signing consents for hospice and transfer today. I page Dr Cathlean Sauer and unit SW to update.  Marjie Skiff Samiyyah Moffa, RN, BSN, [facility] Palliative Medicine Team 10/02/2017 8:40 AM Office [phone]   ADDENDUM: Hospice consents have been signed. SW Kathlee Nations will set up transport, as d/c orders are in place. Family is at bedside and updated. Taren is angry, agitated, crying, insisting on leaving. I have requested RN give Ativan.  Marjie Skiff [place], RN, BSN, [facility] Palliative Medicine Team 10/02/2017 10:16 AM Office [phone]

## 2017-10-02 NOTE — Discharge Summary (Signed)
Physician Discharge Summary  Jonathon Keller UKG:[id number] DOB: October 25, 1940 DOA: 09/23/2017  PCP: [name], MD  Admit date: 09/23/2017 Discharge date: 10/02/2017  Admitted From: Home  Disposition:  Hospice facility  Recommendations for Outpatient Follow-up and new medication changes:  1. Follow up with PCP in 1-week 2. Patient has been placed on Olanzapine 10 mg at night 3. Continue decadron 4 mg daily, indefinite 4. Seizure prophylaxis with Keppra (levetiracetam) 500 mg bid.    Home Health: no  Equipment/Devices: na   Discharge Condition: Stable CODE STATUS: DNR  Diet recommendation: Regular diet.   Diet recommendations: Dysphagia 1 (puree);Thin liquid Liquids provided via: Cup Medication Administration: Whole meds with puree Supervision: Full supervision/cueing for compensatory strategies Compensations: Minimize environmental distractions Postural Changes and/or Swallow Maneuvers: Out of bed for meals  Brief/Interim Summary: 77 year old male who presented with left-sided weakness and altered mentation. He does have the significant past medicalmedical history of dementia,depressionand tobacco abuse. Patient was noted to be more confused over the last several days prior to hospitalization. On the day of admission he was noted to have left facial droop and left-sided weakness, associated with facial twitching. On his initial physical examination blood pressure 138/72,heart rate80,respiratory rate 12,oxygen saturation 98%.Moist mucous membranes, lungs clear to auscultation bilaterally, heart S1-S2 present rhythmic, the abdomen was soft nontender. Patient was confused, disoriented, left facial droop. 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  Patient was admitted to the hospital with the working diagnosis of altered mentation due to newly diagnosed brain mass complicated with right to left midline shift.  1.  2.9 cm well-circumscribed heterogeneous mass in the right frontal lobe, complicated by 4 mm right to left midline shift.  Patient was admitted to the medical ward, he was placed on a remote telemetry monitor, he received systemic steroids with IV dexamethasone, prophylactic antiseizure medication including Keppra and valproic acid.  Electroencephalography showed right hemispheric slowing and diffuse slowing of the background,  consistent with known mass lesion.  CT chest, abdomen and pelvis with no primary malignant lesion found.  Neurosurgery was consulted, surgery was offered, but considering the patient's dementia and poor prognosis his family decided to hold on any surgical interventions.  Palliative care team was consulted, hospice was arranged for discharge.  He had episodes of confusion and agitation, he was placed on olanzapine with good toleration and response.  Patient will continue dexamethasone 4 mg daily for indefinite time, seizure prophylactic with Keppra twice daily.  2.  Dysphasia.  Patient was seen by speech therapy, recommendations for dysphagia 1 diet, with aspiration precautions.  3.  Dementia.  Patient responded well to olanzapine at night.  He does have chronic confusion and disorientation, no further agitation noted.  4.  Tobacco abuse.  Patient was placed on a nicotine patch with good toleration.   Discharge Diagnoses:  Principal Problem:   Brain mass Active Problems:   Dementia   Left-sided weakness   Tobacco abuse   Acute metabolic encephalopathy   Palliative care by specialist   Goals of care, counseling/discussion   Agitation   Altered mental status    Discharge Instructions   Allergies as of 10/02/2017   No Known Allergies      Medication List  STOP taking these medications   HYDROcodone-acetaminophen 5-325 MG tablet Commonly known as:  NORCO/VICODIN   ibuprofen 600 MG tablet Commonly known as:  ADVIL,MOTRIN   silver sulfADIAZINE 1 % cream Commonly known as:  SILVADENE     TAKE these medications   dexamethasone 4 MG tablet Commonly known as:  DECADRON Take 1 tablet (4 mg total) by mouth daily. Start taking on:  10/03/2017   levETIRAcetam 500 MG tablet Commonly known as:  KEPPRA Take 1 tablet (500 mg total) by mouth 2 (two) times daily.   nicotine 21 mg/24hr patch Commonly known as:  NICODERM CQ - dosed in mg/24 hours Place 1 patch (21 mg total) onto the skin daily. Start taking on:  10/03/2017   OLANZapine zydis 10 MG disintegrating tablet Commonly known as:  ZYPREXA Take 1 tablet (10 mg total) by mouth at bedtime.       No Known Allergies  Consultations:  Neurosurgery  Palliative Care   Procedures/Studies: Ct Head Wo Contrast  Result Date: 09/26/2017 CLINICAL DATA:  Initial evaluation for altered mental status, brain neoplasm. EXAM: CT HEAD WITHOUT CONTRAST TECHNIQUE: Contiguous axial images were obtained from the base of the skull through the vertex without intravenous contrast. COMPARISON:  Previous MRI from 09/25/2017 as well as prior head CT from 09/23/2017. FINDINGS: Brain: Previously identified well-circumscribed heterogeneous mass at the right frontal lobe again seen, stable in size measuring 2.5 x 2.6 x 2.8 cm. Surrounding vasogenic edema with regional mass effect with up to 4 mm of right-to-left shift is unchanged. No evidence for hydrocephalus or ventricular trapping. Basilar cisterns remain patent. Stable atrophy with chronic small vessel ischemic disease. No acute intracranial hemorrhage. No other acute large vessel territory infarct. No other mass lesion. No extra-axial fluid collection. Vascular: No hyperdense vessel. Scattered vascular calcifications noted within the  carotid siphons. Skull: Scalp soft tissues and calvarium within normal limits. Sinuses/Orbits: Globes and orbital soft tissues within normal limits. Left maxillary sinus retention cyst noted. Paranasal sinuses and mastoid air cells are otherwise clear. Other: None. IMPRESSION: 1. Stable size and appearance of heterogeneous right frontal mass with associated edema, regional mass effect, and 4 mm of right-to-left shift. 2. No other new acute intracranial abnormality. Electronically Signed   By: Jeannine Boga M.D.   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 EXAM: CT CHEST, ABDOMEN, AND PELVIS WITH CONTRAST TECHNIQUE: Multidetector CT imaging of the chest, abdomen and pelvis was performed following the standard protocol during bolus administration of intravenous contrast. CONTRAST:  128mL OMNIPAQUE IOHEXOL 300 MG/ML  SOLN COMPARISON:  None. FINDINGS: CT CHEST FINDINGS Cardiovascular: There is no cardiomegaly or pericardial effusion. Advanced 3 vessel coronary vascular calcification. There is mild atherosclerotic calcification of the thoracic aorta. No aneurysmal dilatation or evidence of dissection. The origins of the great vessels of the aortic arch appear patent as visualized. The central pulmonary arteries are grossly unremarkable. Mediastinum/Nodes: There is no hilar or mediastinal adenopathy. Esophagus is grossly unremarkable as visualized. There is a 1 cm calcified right thyroid nodule. Lungs/Pleura: Minimal bibasilar atelectatic changes noted. The lungs are otherwise clear. There is no pleural effusion or pneumothorax. The central airways are patent. Musculoskeletal: No chest wall mass or suspicious bone lesions identified. CT ABDOMEN PELVIS FINDINGS No intra-abdominal free air or free fluid. Hepatobiliary: The liver is unremarkable. No intrahepatic biliary ductal dilatation. Mild thickened appearance  of the gallbladder wall may be  related to incomplete distension or chronic inflammation. There are probable small stones within the gallbladder. No evidence of acute cholecystitis by CT. Ultrasound may provide better evaluation of the gallbladder if clinically indicated. The common bile duct is mildly dilated measuring up to 12 mm in diameter. No calcified stone noted in the central CBD. Pancreas: There are scattered pancreatic calcifications, likely sequela of chronic pancreatitis. Correlation with pancreatic enzymes recommended to exclude acute pancreatitis. Spleen: Normal in size without focal abnormality. Adrenals/Urinary Tract: The right adrenal gland is unremarkable. There is a 2 cm indeterminate left adrenal nodule. MRI is recommended for further characterization. There is also thickening of the inferior aspect of the lateral limb of the left adrenal gland measuring up to 1.5 cm in thickness. Small left renal cysts. There is no hydronephrosis on either side. The right kidney is unremarkable. The visualized ureters and urinary bladder are unremarkable as well. Stomach/Bowel: There is no bowel obstruction or active inflammation. Normal appendix. Vascular/Lymphatic: There is advanced aortoiliac atherosclerotic disease. The splenic vein, SMV, and main portal vein are patent. No portal venous gas. There is no adenopathy. Reproductive: The prostate gland is enlarged measuring 5.7 cm in diameter. Other: None Musculoskeletal: Mild degenerative changes. No acute osseous pathology. IMPRESSION: 1. No acute intrathoracic, abdominal, or pelvic pathology. 2. Probable small gallstones. No definite evidence of acute cholecystitis by CT. Ultrasound may provide better evaluation of the gallbladder if clinically indicated. 3. Scattered pancreatic calcifications, likely sequela of chronic pancreatitis. Clinical correlation is recommended. 4. **An incidental finding of potential clinical significance has been found. Left adrenal  nodules. Further characterization with MRI, adrenal protocol, on a nonemergent basis recommended.** Electronically Signed   By: Anner Crete M.D.   On: 09/23/2017 21:57   Mr [name] MH Contrast  Result Date: 09/25/2017 CLINICAL DATA:  Initial evaluation for intracranial mass. EXAM: MRI HEAD WITHOUT AND WITH CONTRAST TECHNIQUE: Multiplanar, multiecho pulse sequences of the brain and surrounding structures were obtained without and with intravenous contrast. CONTRAST:  35mL MULTIHANCE GADOBENATE DIMEGLUMINE 529 MG/ML IV SOLN COMPARISON:  Prior CT from 09/23/2017. FINDINGS: Brain: Heterogeneously and avidly enhancing mass positioned within the right frontal operculum measures 2.7 x 2.7 x 2.8 cm (series 20001, image 41). This corresponds with abnormality seen on prior CT. Scattered foci of internal susceptibility artifact consistent with necrosis and/or blood products. Surrounding vasogenic edema with regional mass effect within the adjacent cerebral hemisphere. Partial effacement of the right lateral ventricle with 4 mm of right-to-left shift. No hydrocephalus or ventricular trapping. Basilar cisterns remain widely patent. Scattered leptomeningeal enhancement within the adjacent right frontal lobe felt to be related to edema. Note made of an additional punctate nodular focus of apparent enhancement within the inferior left cerebellar hemisphere, faintly visible on axial post gad sequence (series 20001, image 11), but also visible on coronal sequence (series 21001, image 12) as well as sagittal sequence (series 22001, image 15). No definite T2/FLAIR correlate. Finding is nonspecific, but favored to be either artifactual or vascular in nature. Attention at follow-up is warranted however. No other abnormal enhancement within the brain. No other mass lesion. No evidence for acute infarct. Underlying atrophy with mild chronic small vessel ischemic disease. No extra-axial fluid collection. Major dural sinuses patent.  Pituitary gland suprasellar region normal. Midline structures intact. Vascular: Major intracranial vascular flow voids are maintained. Skull and upper cervical spine: Craniocervical junction normal. Upper cervical spine normal. Bone marrow signal intensity within normal limits. No scalp soft tissue abnormality. Sinuses/Orbits: Globes and orbital soft  tissues within normal limits. Patient status post lens extraction bilaterally. Left maxillary sinus retention cyst noted. Paranasal sinuses are otherwise clear. No mastoid effusion. Inner ear structures normal. Other: None. IMPRESSION: 1. 2.2 x 2.7 x 2.8 cm enhancing right frontal lobe mass, indeterminate. Primary differential considerations include high-grade glioma versus solitary intracranial metastasis. Associated vasogenic edema with regional mass effect with 4 mm of right-to-left shift. 2. Additional punctate nodular focus of enhancement within the inferior left cerebellar hemisphere, favored to be either artifactual and/or vascular in nature, although attention at follow-up is recommended. Electronically Signed   By: Jeannine Boga M.D.   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 EXAM: CT CHEST, ABDOMEN, AND PELVIS WITH CONTRAST TECHNIQUE: Multidetector CT imaging of the chest, abdomen and pelvis was performed following the standard protocol during bolus administration of intravenous contrast. CONTRAST:  177mL OMNIPAQUE IOHEXOL 300 MG/ML  SOLN COMPARISON:  None. FINDINGS: CT CHEST FINDINGS Cardiovascular: There is no cardiomegaly or pericardial effusion. Advanced 3 vessel coronary vascular calcification. There is mild atherosclerotic calcification of the thoracic aorta. No aneurysmal dilatation or evidence of dissection. The origins of the great vessels of the aortic arch appear patent as visualized. The central pulmonary arteries are grossly  unremarkable. Mediastinum/Nodes: There is no hilar or mediastinal adenopathy. Esophagus is grossly unremarkable as visualized. There is a 1 cm calcified right thyroid nodule. Lungs/Pleura: Minimal bibasilar atelectatic changes noted. The lungs are otherwise clear. There is no pleural effusion or pneumothorax. The central airways are patent. Musculoskeletal: No chest wall mass or suspicious bone lesions identified. CT ABDOMEN PELVIS FINDINGS No intra-abdominal free air or free fluid. Hepatobiliary: The liver is unremarkable. No intrahepatic biliary ductal dilatation. Mild thickened appearance of the gallbladder wall may be related to incomplete distension or chronic inflammation. There are probable small stones within the gallbladder. No evidence of acute cholecystitis by CT. Ultrasound may provide better evaluation of the gallbladder if clinically indicated. The common bile duct is mildly dilated measuring up to 12 mm in diameter. No calcified stone noted in the central CBD. Pancreas: There are scattered pancreatic calcifications, likely sequela of chronic pancreatitis. Correlation with pancreatic enzymes recommended to exclude acute pancreatitis. Spleen: Normal in size without focal abnormality. Adrenals/Urinary Tract: The right adrenal gland is unremarkable. There is a 2 cm indeterminate left adrenal nodule. MRI is recommended for further characterization. There is also thickening of the inferior aspect of the lateral limb of the left adrenal gland measuring up to 1.5 cm in thickness. Small left renal cysts. There is no hydronephrosis on either side. The right kidney is unremarkable. The visualized ureters and urinary bladder are unremarkable as well. Stomach/Bowel: There is no bowel obstruction or active inflammation. Normal appendix. Vascular/Lymphatic: There is advanced aortoiliac atherosclerotic disease. The splenic vein, SMV, and main portal vein are patent. No portal venous gas. There is no adenopathy.  Reproductive: The prostate gland is enlarged measuring 5.7 cm in diameter. Other: None Musculoskeletal: Mild degenerative changes. No acute osseous pathology. IMPRESSION: 1. No acute intrathoracic, abdominal, or pelvic pathology. 2. Probable small gallstones. No definite evidence of acute cholecystitis by CT. Ultrasound may provide better evaluation of the gallbladder if clinically indicated. 3. Scattered pancreatic calcifications, likely sequela of chronic pancreatitis. Clinical correlation is recommended. 4. **An incidental finding of potential clinical significance has been found. Left adrenal nodules. Further characterization with MRI, adrenal protocol, on a nonemergent basis recommended.** Electronically  Signed   By: Anner Crete M.D.   On: 09/23/2017 21:57   Dg Chest Port 1 View  Result Date: 09/23/2017 CLINICAL DATA:  Altered mental status EXAM: PORTABLE CHEST 1 VIEW COMPARISON:  CT brain 09/23/2017 FINDINGS: Carotid vascular calcification. No acute airspace disease or effusion. Normal cardiomediastinal silhouette. No pneumothorax. IMPRESSION: No active disease. Electronically Signed   By: Donavan Foil M.D.   On: 09/23/2017 20:26   Dg Swallowing Func-speech Pathology  Result Date: 09/26/2017 Objective Swallowing Evaluation: Type of Study: MBS-Modified Barium Swallow Study  Patient Details Name: Zaivion Kundrat MRN: [id number] Date of Birth: 1941/01/06 Today's Date: 09/26/2017 Time: SLP Start Time (ACUTE ONLY): 1220 -SLP Stop Time (ACUTE ONLY): 1245 SLP Time Calculation (min) (ACUTE ONLY): 25 min Past Medical History: Past Medical History: Diagnosis Date . Allergy  . Anxiety  . Cataract  . Dementia  . Depression  . Eczema  . Tobacco abuse  Past Surgical History: No past surgical history on file. HPI: 77 yo male adm to [facility] with AMS, confusion and left sided weakness.  Pt CT chest showed bilateral ATX 09/23/17.  PMH + for anxiety, depression, tobacco use. Pt found to have 2.9 cm brain mass with 4 mm midline  shift.  He is on seizure precautions and neuro surgery has been consulted.  Subjective: pt removing blankets, towel Assessment / Plan / Recommendation CHL IP CLINICAL IMPRESSIONS 09/26/2017 Clinical Impression Pt presents with moderate sensorimotor dysphagia impacting both oral and pharyngeal stages of the swallow; pt aspirated both thin and nectar thick liquids. Oral stage characterized by oral holding, prolonged oral preparation, decreased bolus cohesion and premature spillage of liquids; mild-moderate oral residue on occasion. With solids, there is prolonged mastication and delayed oral transit. Pharyngeal stage is noted for decreased hyolaryngeal excursion resulting in reduced airway closure and vallecular (puree: moderate, soft solid: mod-severe, liquids: mild) and pyriform (thin, nectar: mild) residue. Pt does not follow commands for second swallow, dry spoon ineffective to cue. Penetration of thin via cup, which spilled into the open airway before the swallow with trace silent aspiration during the swallow. Pt does not follow cues for cough or compensatory manuevers. Thin residue in the pyriform sinuses was also penetrated during subsequent swallow of nectar thick liquid. Consistent, shallow penetration of nectar occurs due to delayed, incomplete closure of the laryngeal vestibule. Pt not responsive to cues to clear airway. With larger sip of nectar, there was apparent premature spillage with moderate aspiration not captured in the frame, however barium observed in the trachea and coating pt's vocal cords. Pt did have a weak, delayed cough response to this larger volume of aspiration. There was no penetration or aspiration with honey thick liquids, purees or soft solid. 13 mm barium tablet passed throught the cervical esophagus without difficulty. Pt impulsive, restless; exam was ended before esophageal sweep as pt began removing his seatbelt and attempting to get up from the chair. Recommend dys 1, honey thick  liquids with full supervision due to impulsivity. Meds can be given whole in puree. Will follow up for tolerance and pt/family education. SLP Visit Diagnosis Dysphagia, oropharyngeal phase (R13.12) Attention and concentration deficit following -- Frontal lobe and executive function deficit following -- Impact on safety and function Moderate aspiration risk   CHL IP TREATMENT RECOMMENDATION 09/26/2017 Treatment Recommendations Therapy as outlined in treatment plan below   Prognosis 09/26/2017 Prognosis for Safe Diet Advancement Fair Barriers to Reach Goals Cognitive deficits;Other (Comment) Barriers/Prognosis Comment -- CHL IP DIET RECOMMENDATION 09/26/2017 SLP Diet Recommendations  Dysphagia 1 (Puree) solids;Honey thick liquids Liquid Administration via Cup Medication Administration Whole meds with puree Compensations Minimize environmental distractions;Slow rate;Small sips/bites;Lingual sweep for clearance of pocketing;Follow solids with liquid Postural Changes Seated upright at 90 degrees   CHL IP OTHER RECOMMENDATIONS 09/26/2017 Recommended Consults -- Oral Care Recommendations Oral care BID Other Recommendations Order thickener from pharmacy;Prohibited food (jello, ice cream, thin soups);Remove water pitcher;Have oral suction available   CHL IP FOLLOW UP RECOMMENDATIONS 09/26/2017 Follow up Recommendations Other (comment)   CHL IP FREQUENCY AND DURATION 09/26/2017 Speech Therapy Frequency (ACUTE ONLY) min 2x/week Treatment Duration 2 weeks      CHL IP ORAL PHASE 09/26/2017 Oral Phase Impaired Oral - Pudding Teaspoon -- Oral - Pudding Cup -- Oral - Honey Teaspoon -- Oral - Honey Cup -- Oral - Nectar Teaspoon -- Oral - Nectar Cup -- Oral - Nectar Straw -- Oral - Thin Teaspoon Left anterior bolus loss;Holding of bolus;Delayed oral transit;Decreased bolus cohesion;Premature spillage Oral - Thin Cup Left anterior bolus loss;Holding of bolus;Delayed oral transit;Decreased bolus cohesion;Premature spillage;Lingual/palatal residue  Oral - Thin Straw -- Oral - Puree Reduced posterior propulsion;Holding of bolus;Delayed oral transit;Decreased bolus cohesion;Lingual/palatal residue Oral - Mech Soft Impaired mastication;Reduced posterior propulsion;Holding of bolus;Lingual/palatal residue;Delayed oral transit;Decreased bolus cohesion Oral - Regular -- Oral - Multi-Consistency -- Oral - Pill Delayed oral transit Oral Phase - Comment --  CHL IP PHARYNGEAL PHASE 09/26/2017 Pharyngeal Phase Impaired Pharyngeal- Pudding Teaspoon -- Pharyngeal -- Pharyngeal- Pudding Cup -- Pharyngeal -- Pharyngeal- Honey Teaspoon -- Pharyngeal -- Pharyngeal- Honey Cup Delayed swallow initiation-vallecula;Reduced laryngeal elevation;Pharyngeal residue - valleculae Pharyngeal -- Pharyngeal- Nectar Teaspoon Delayed swallow initiation-pyriform sinuses;Reduced epiglottic inversion;Reduced laryngeal elevation;Reduced anterior laryngeal mobility;Reduced airway/laryngeal closure;Penetration/Aspiration during swallow;Pharyngeal residue - valleculae;Pharyngeal residue - pyriform Pharyngeal Material enters airway, remains ABOVE vocal cords and not ejected out Pharyngeal- Nectar Cup Delayed swallow initiation-pyriform sinuses;Reduced epiglottic inversion;Reduced anterior laryngeal mobility;Reduced laryngeal elevation;Reduced airway/laryngeal closure;Penetration/Aspiration before swallow;Penetration/Aspiration during swallow;Moderate aspiration;Pharyngeal residue - valleculae;Pharyngeal residue - pyriform Pharyngeal Material enters airway, remains ABOVE vocal cords and not ejected out;Material enters airway, passes BELOW cords and not ejected out despite cough attempt by patient Pharyngeal- Nectar Straw -- Pharyngeal -- Pharyngeal- Thin Teaspoon Delayed swallow initiation-vallecula;Reduced anterior laryngeal mobility;Reduced laryngeal elevation;Reduced airway/laryngeal closure;Pharyngeal residue - valleculae;Pharyngeal residue - pyriform Pharyngeal -- Pharyngeal- Thin Cup Delayed  swallow initiation-pyriform sinuses;Reduced epiglottic inversion;Reduced anterior laryngeal mobility;Reduced laryngeal elevation;Reduced airway/laryngeal closure;Penetration/Aspiration before swallow;Penetration/Aspiration during swallow;Penetration/Apiration after swallow;Pharyngeal residue - valleculae;Pharyngeal residue - pyriform;Trace aspiration Pharyngeal Material enters airway, passes BELOW cords without attempt by patient to eject out (silent aspiration);Material enters airway, remains ABOVE vocal cords and not ejected out Pharyngeal- Thin Straw -- Pharyngeal -- Pharyngeal- Puree Delayed swallow initiation-vallecula;Reduced anterior laryngeal mobility;Reduced laryngeal elevation;Pharyngeal residue - valleculae Pharyngeal -- Pharyngeal- Mechanical Soft Delayed swallow initiation-vallecula;Reduced laryngeal elevation;Reduced anterior laryngeal mobility;Pharyngeal residue - valleculae Pharyngeal -- Pharyngeal- Regular -- Pharyngeal -- Pharyngeal- Multi-consistency -- Pharyngeal -- Pharyngeal- Pill Delayed swallow initiation-vallecula Pharyngeal -- Pharyngeal Comment --  CH[facility] IP CERVICAL ESOPHAGEAL PHASE 09/26/2017 Cervical Esophageal Phase WFL Pudding Teaspoon -- Pudding Cup -- Honey Teaspoon -- Honey Cup -- Nectar Teaspoon -- Nectar Cup -- Nectar Straw -- Thin Teaspoon -- Thin Cup -- Thin Straw -- Puree -- Mechanical Soft -- Regular -- Multi-consistency -- Pill -- Cervical Esophageal Comment -- [name], MS, CCC-SLP Speech-Language Pathologist [phone] No flowsheet data found. [name] 09/26/2017, 1:23 PM              Ct Head Code Stroke Wo Contrast  Result Date: 09/23/2017  CLINICAL DATA:  77 y/o M; left-sided weakness and left facial droop. EXAM: CT HEAD WITHOUT CONTRAST TECHNIQUE: Contiguous axial images were obtained from the base of the skull through the vertex without intravenous contrast. COMPARISON:  None. FINDINGS: Brain: Right frontal lobe well-circumscribed heterogeneous mass measuring 2.7  x 2.4 x 2.9 cm (AP x ML x CC series 3, image 22 and series 5, image 29). Extensive surrounding edema throughout the right frontal lobe and insula white matter. Associated mass effect with partial effacement of right lateral ventricle and 4 mm right-to-left midline shift. No herniation. No additional findings for stroke, hemorrhage, or focal mass effect identified. Mild chronic microvascular ischemic changes and parenchymal volume loss of the brain. Vascular: Calcific atherosclerosis of carotid siphons. No hyperdense vessel identified. Skull: Normal. Negative for fracture or focal lesion. Sinuses/Orbits: No acute finding. Other: Bilateral intra-ocular lens replacement. IMPRESSION: 1. 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 This can be further characterized with MRI with and without contrast. Associated edema and mass effect with 4 mm right-to-left midline shift. 2. No additional finding for stroke, hemorrhage, or focal mass effect. Electronically Signed   By: Kristine Garbe M.D.   On: 09/23/2017 17:34       Subjective: Patient is awake and alert, follows commands. No headache, nausea or vomiting. No chest pain or dyspnea.   Discharge Exam: Vitals:   10/02/17 0555 10/02/17 0857  BP: 112/64 112/60  Pulse: (!) 51 (!) 57  Resp: 18 18  Temp:  (!) 97.5 F (36.4 C)  SpO2: 99%    Vitals:   10/01/17 1657 10/01/17 2048 10/02/17 0555 10/02/17 0857  BP: (!) 142/78 (!) 162/81 112/64 112/60  Pulse: 86 68 (!) 51 (!) 57  Resp: 17 18 18 18   Temp: 98.2 F (36.8 C) 98.2 F (36.8 C)  (!) 97.5 F (36.4 C)  TempSrc: Oral Oral  Axillary  SpO2: 97% 98% 99%   Weight:      Height:        General: Not in pain or dyspnea  Neurology: Awake and alert, persistent left side weakness.  E ENT: no pallor, no icterus, oral mucosa moist Cardiovascular: No JVD. S1-S2 present, rhythmic, no gallops, rubs, or murmurs. No lower extremity edema. Pulmonary:  vesicular breath sounds bilaterally, adequate air movement, no wheezing, rhonchi or rales. Gastrointestinal. Abdomen with no organomegaly, non tender, no rebound or guarding Skin. No rashes Musculoskeletal: no joint deformities   The results of significant diagnostics from this hospitalization (including imaging, microbiology, ancillary and laboratory) are listed below for reference.     Microbiology: No results found for this or any previous visit (from the past 240 hour(s)).   Labs: BNP (last 3 results) No results for input(s): BNP in the last 8760 hours. Basic Metabolic Panel: No results for input(s): NA, K, CL, CO2, GLUCOSE, BUN, CREATININE, CALCIUM, MG, PHOS in the last 168 hours. Liver Function Tests: No results for input(s): AST, ALT, ALKPHOS, BILITOT, PROT, ALBUMIN in the last 168 hours. No results for input(s): LIPASE, AMYLASE in the last 168 hours. No results for input(s): AMMONIA in the last 168 hours. CBC: No results for input(s): WBC, NEUTROABS, HGB, HCT, MCV, PLT in the last 168 hours. Cardiac Enzymes: No results for input(s): CKTOTAL, CKMB, CKMBINDEX, TROPONINI in the last 168 hours. BNP: Invalid input(s): POCBNP CBG: Recent Labs  Lab 09/28/17 0606 09/28/17 0745 09/30/17 0657 10/01/17 0637 10/02/17 0716  GLUCAP 90 109* 116* 94 100*   D-Dimer No results  for input(s): DDIMER in the last 72 hours. Hgb A1c No results for input(s): HGBA1C in the last 72 hours. Lipid Profile No results for input(s): CHOL, HDL, LDLCALC, TRIG, CHOLHDL, LDLDIRECT in the last 72 hours. Thyroid function studies No results for input(s): TSH, T4TOTAL, T3FREE, THYROIDAB in the last 72 hours.  Invalid input(s): FREET3 Anemia work up No results for input(s): VITAMINB12, FOLATE, FERRITIN, TIBC, IRON, RETICCTPCT in the last 72 hours. Urinalysis No results found for: COLORURINE, APPEARANCEUR, LABSPEC, [place], GLUCOSEU, HGBUR, BILIRUBINUR, KETONESUR, PROTEINUR, UROBILINOGEN, NITRITE,  LEUKOCYTESUR Sepsis Labs Invalid input(s): PROCALCITONIN,  WBC,  LACTICIDVEN Microbiology No results found for this or any previous visit (from the past 240 hour(s)).   Time coordinating discharge: 45 minutes  SIGNED:   Tawni Millers, MD  [facility] 10/02/2017, 9:53 AM Pager [phone]  If 7PM-7AM, please contact night-coverage [email or web address] Password [email or web address]

## 2017-10-02 NOTE — Progress Notes (Signed)
Discharge to: [place] Anticipated discharge date: 10/02/17 Family notified: Yes, sister at bedside Transportation by: [facility]  Report #: [phone]  [place] signing off.  Laveda Abbe LCSW [phone]

## 2017-10-02 NOTE — Clinical Social Work Note (Signed)
Clinical Social Work Assessment  Patient Details  Name: Jonathon Keller MRN: [id number] Date of Birth: 09/04/1940  Date of referral:  10/02/17               Reason for consult:  Facility Placement                Permission sought to share information with:  Facility Contact Representative, Family Supports Permission granted to share information::  Yes, Verbal Permission Granted  Name::     Linda  Agency::  [facility]  Relationship::  Sister  Contact Information:     Housing/Transportation Living arrangements for the past 2 months:  Independent Living Facility Source of Information:  Siblings Patient Interpreter Needed:  None Criminal Activity/Legal Involvement Pertinent to Current Situation/Hospitalization:  No - Comment as needed Significant Relationships:  Siblings Lives with:  Self, Facility Resident Do you feel safe going back to the place where you live?  Yes Need for family participation in patient care:  Yes (Comment)(patient not oriented)  Care giving concerns:  Patient is at end of life and family would like residential hospice placement.   Social Worker assessment / plan:  CSW met with patient's sister at bedside to introduce self and explain what had been worked on to coordinate discharge for the patient. CSW answered patient's sister's questions.  Employment status:  Retired Insurance information:  Managed Medicare PT Recommendations:  Skilled Nursing Facility Information / Referral to community resources:     Patient/Family's Response to care:  Patient's sister agreeable to residential hospice placement.  Patient/Family's Understanding of and Emotional Response to Diagnosis, Current Treatment, and Prognosis: Patient's sister discussed how she was very appreciative of CSW assisting with placement for the patient, and finding where he could go to get the care that he needs. Patient's sister is hopeful that they can calm his symptoms and help him stay  comfortable during end of life.  Emotional Assessment Appearance:  Appears stated age Attitude/Demeanor/Rapport:  Angry Affect (typically observed):  Frustrated Orientation:  Oriented to Self Alcohol / Substance use:  Not Applicable Psych involvement (Current and /or in the community):  No (Comment)  Discharge Needs  Concerns to be addressed:  Care Coordination Readmission within the last 30 days:  No Current discharge risk:  Terminally ill, Dependent with Mobility Barriers to Discharge:  No Barriers Identified    M , LCSW 10/02/2017, 10:30 AM  

## 2017-10-02 NOTE — Consult Note (Signed)
[facility]: Met with pt's sister and BIL. She is his HCPOA. Discussed with [name] the hospice philosophy and levels of care. She is in agreement with this and does agree to comfort care. Spoke to our MD Dr. Gwynneth Munson and she has approved him to come to the [facility]. Spoke to [employer] and she will set up transport. [name] signed papers for him to transport to [facility] for comfort care. Webb Silversmith RN [phone]

## 2017-10-23 DEATH — deceased

## 2019-12-13 IMAGING — CT CT HEAD CODE STROKE
3 series · 14 of 47 positions shown, 16 images · non-contrast
Comparison: None.

CLINICAL DATA: 76 y/o M; left-sided weakness and left facial droop.

EXAM:
CT HEAD WITHOUT CONTRAST
TECHNIQUE: Contiguous axial images were obtained from the base of the skull
through the vertex without intravenous contrast.

[Series 3: head 5.0 st · axial · 0.43mm/px · z∈[+1112,+1247]mm · 8 of 33 slices shown, 10 images]
[im 3/33  brain]
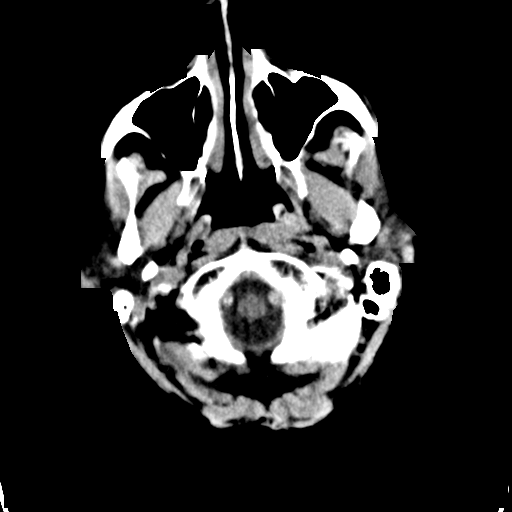
[im 3/33  bone]
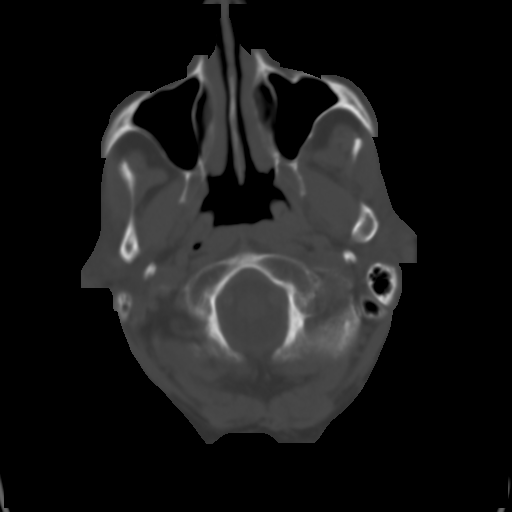
[im 7/33  brain]
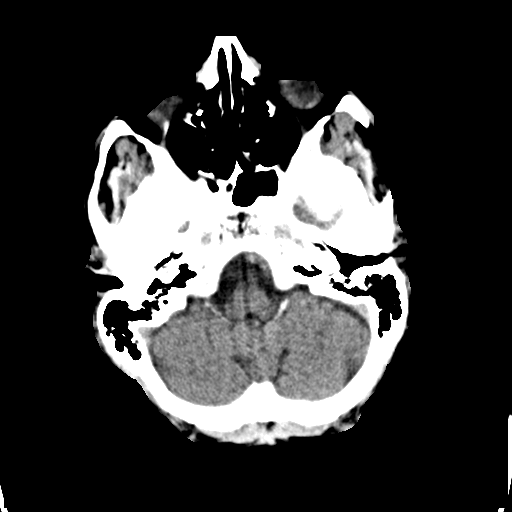
[im 10/33  brain]
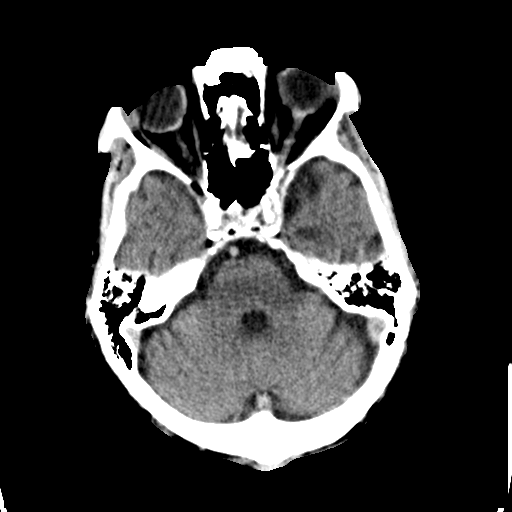
[im 15/33  brain]
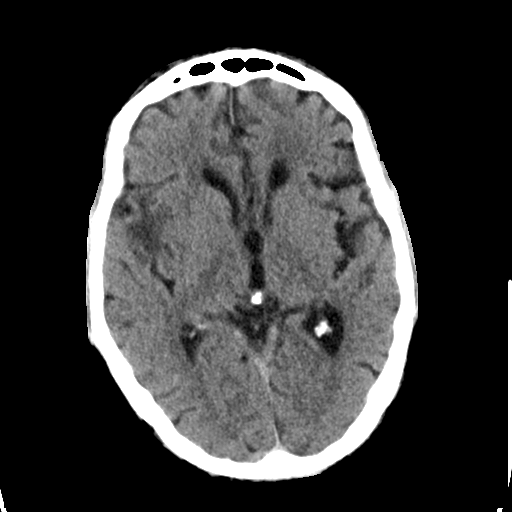
[im 18/33  brain]
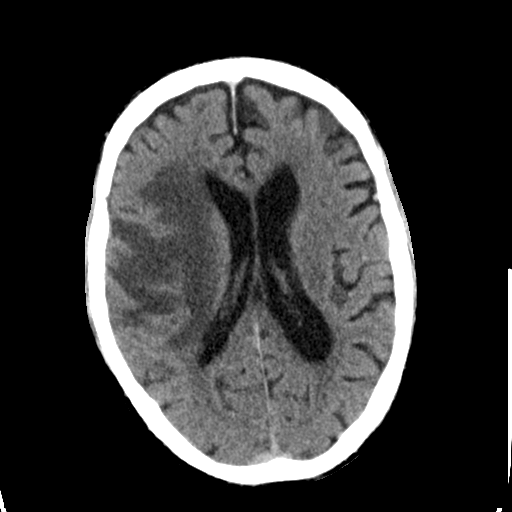
[im 18/33  bone]
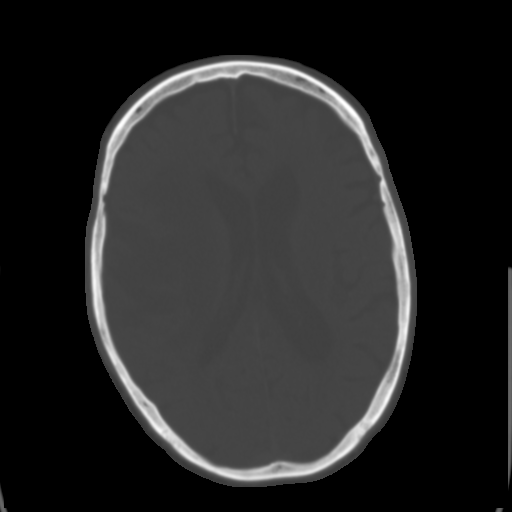
[im 23/33  brain]
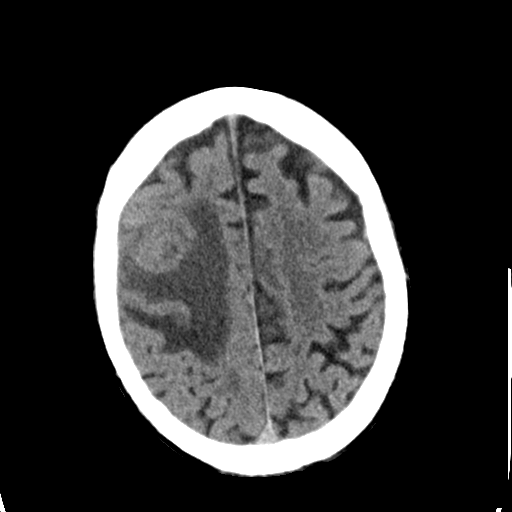
[im 26/33  brain]
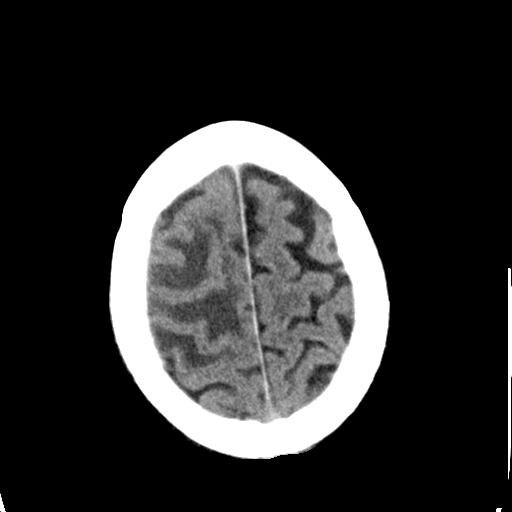
[im 30/33  brain]
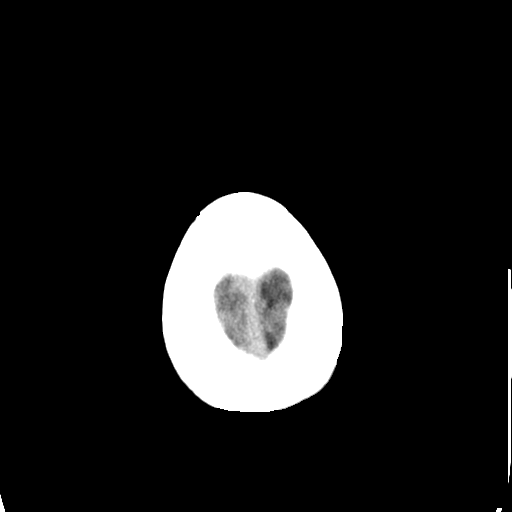

[Series 5: head 3.0 cor st · coronal · 0.31mm/px · 3 of 69 slices shown]
[im 23/69  brain]
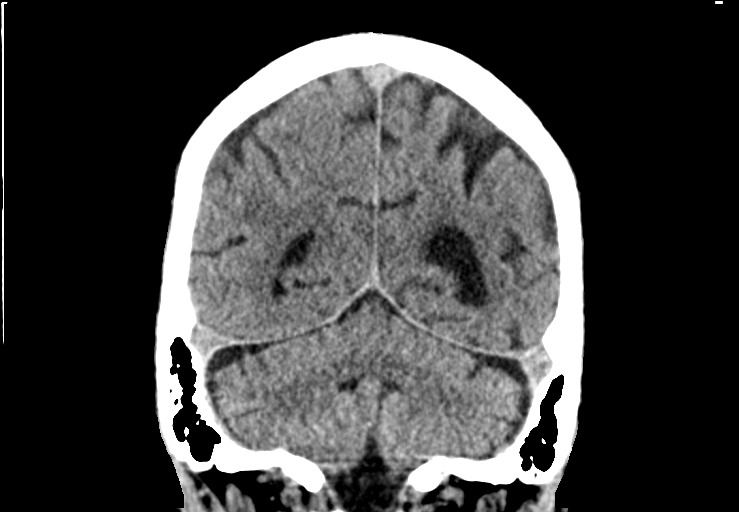
[im 31/69  brain]
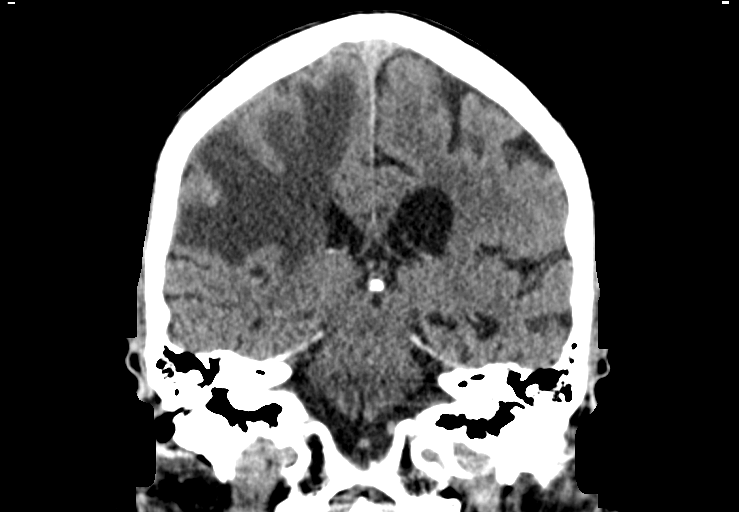
[im 38/69  brain]
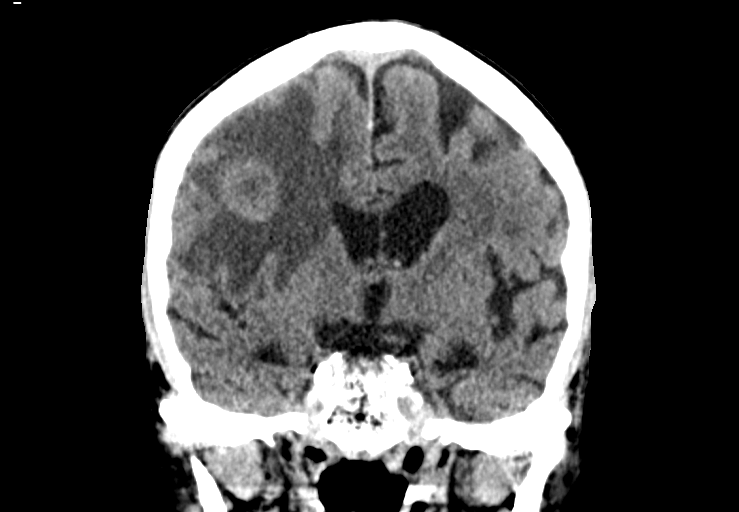

[Series 6: head 3.0 sag st · sagittal · 0.32mm/px · 3 of 65 slices shown]
[im 22/65  brain]
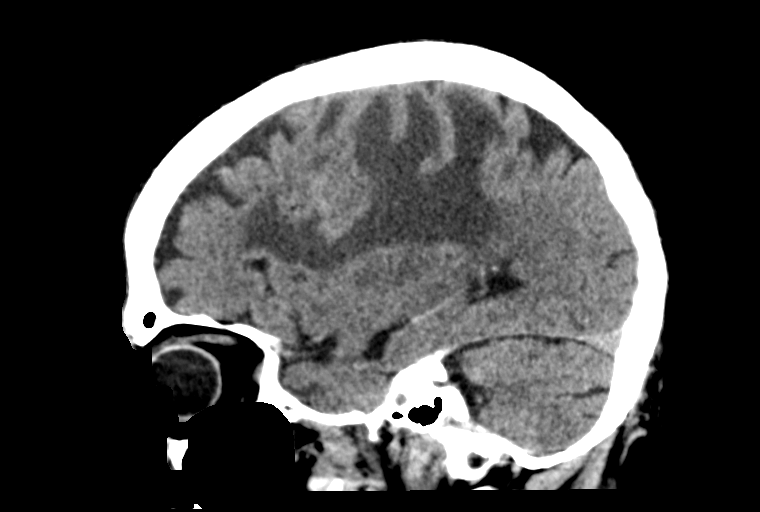
[im 33/65  brain]
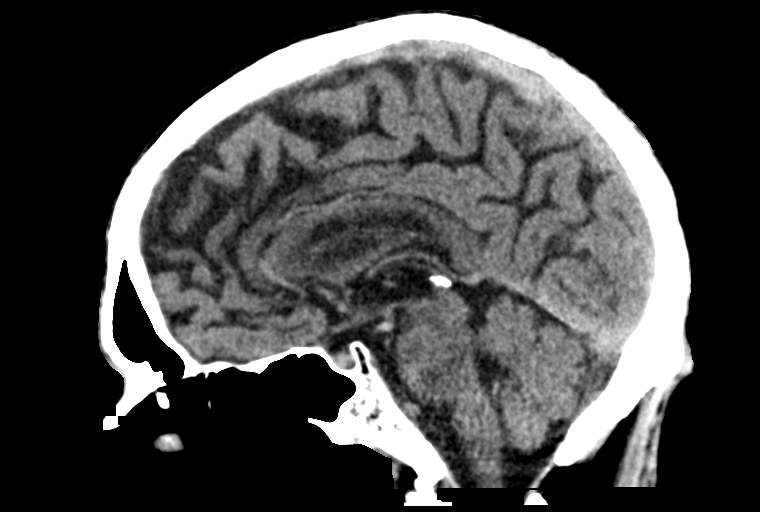
[im 43/65  brain]
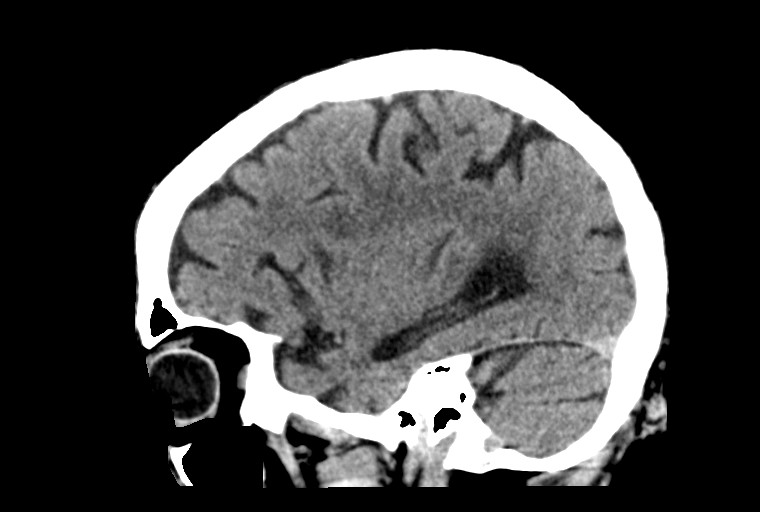

[14 of 47 positions shown; findings below may reference images not displayed]

FINDINGS: Brain: Right frontal lobe well-circumscribed heterogeneous mass
measuring 2.7 x 2.4 x 2.9 cm (AP x ML x CC series 3, image 22 and
series 5, image 29). Extensive surrounding edema throughout the
right frontal lobe and insula white matter. Associated mass effect
with partial effacement of right lateral ventricle and 4 mm
right-to-left midline shift. No herniation. No additional findings
for stroke, hemorrhage, or focal mass effect identified. Mild
chronic microvascular ischemic changes and parenchymal volume loss
of the brain.

Vascular: Calcific atherosclerosis of carotid siphons. No hyperdense
vessel identified.

Skull: Normal. Negative for fracture or focal lesion.

Sinuses/Orbits: No acute finding.

Other: Bilateral intra-ocular lens replacement.
IMPRESSION: 1. 2.9 cm well-circumscribed heterogeneous mass in the right frontal
lobe, likely neoplasm. This can be further characterized with MRI
with and without contrast. Associated edema and mass effect with 4
mm right-to-left midline shift.
2. No additional finding for stroke, hemorrhage, or focal mass
effect.

By: Jack Da Eve M.D.

## 2019-12-16 IMAGING — RF DG SWALLOWING FUNCTION - [facility]
1 series · 18 of 24 positions shown · non-contrast
Comparison: none

[Series 1: run · 22 acquisitions, 18 frames shown]
[im 1/22]
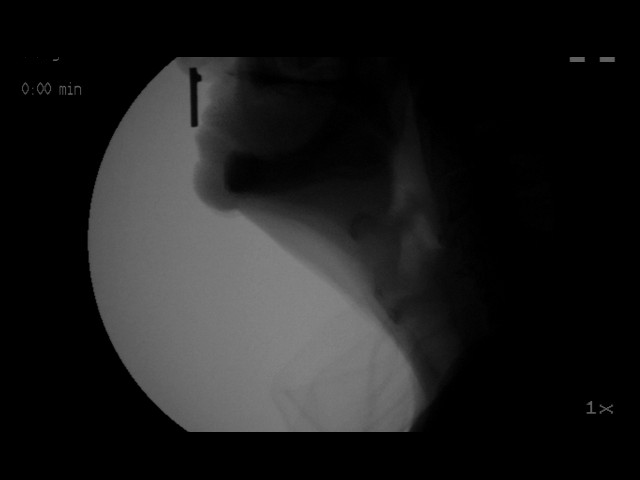
[im 3/22]
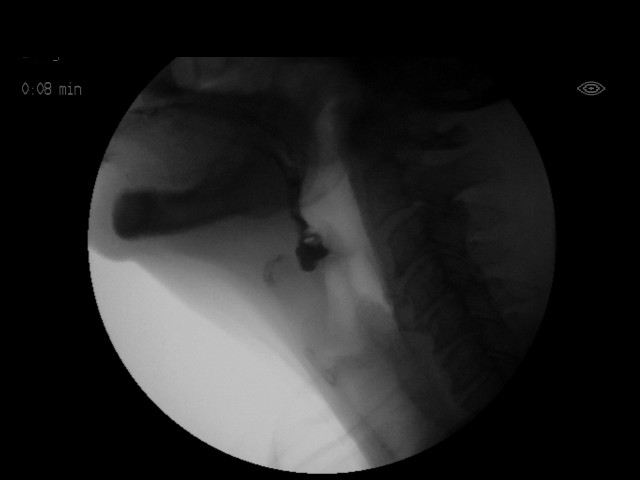
[im 3/22]
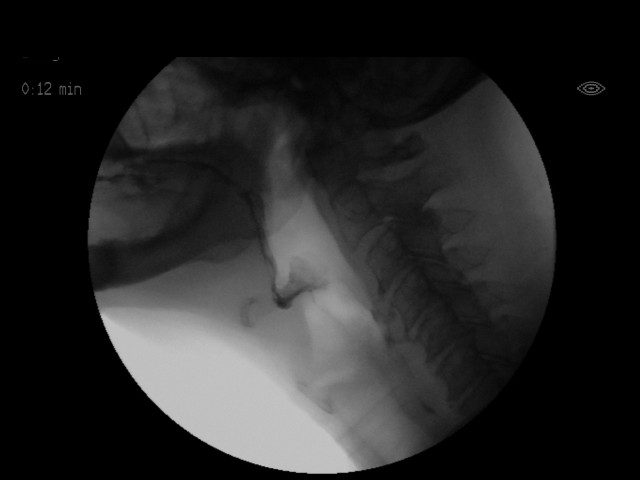
[im 4/22]
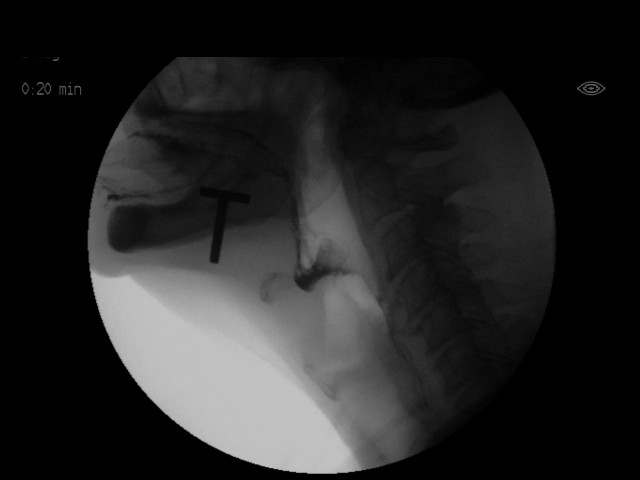
[im 6/22]
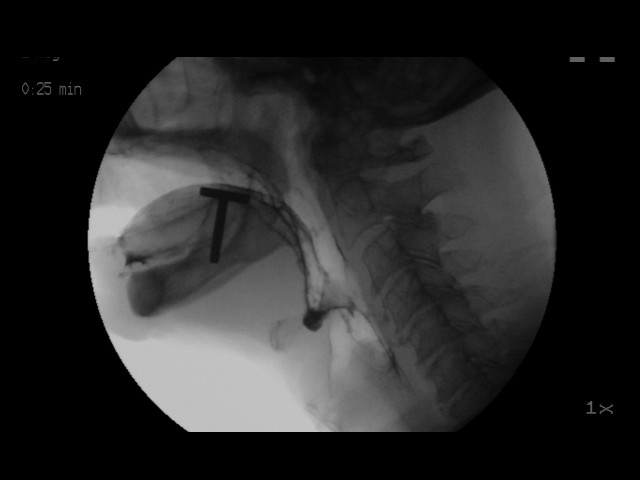
[im 7/22]
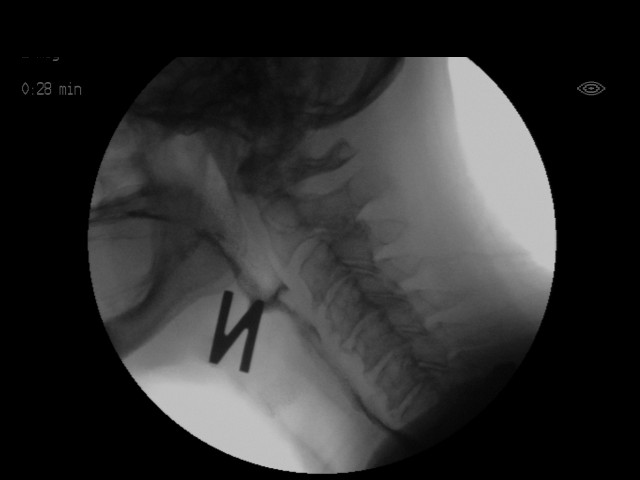
[im 8/22]
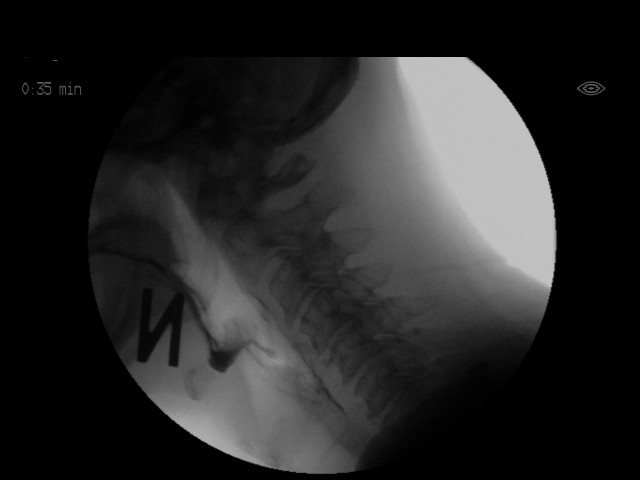
[im 10/22]
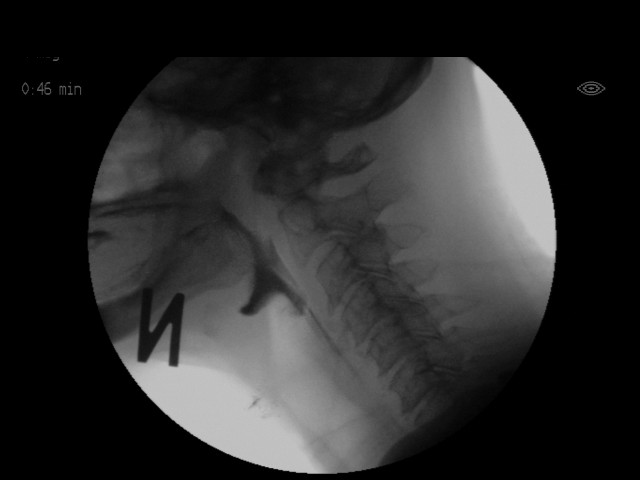
[im 11/22]
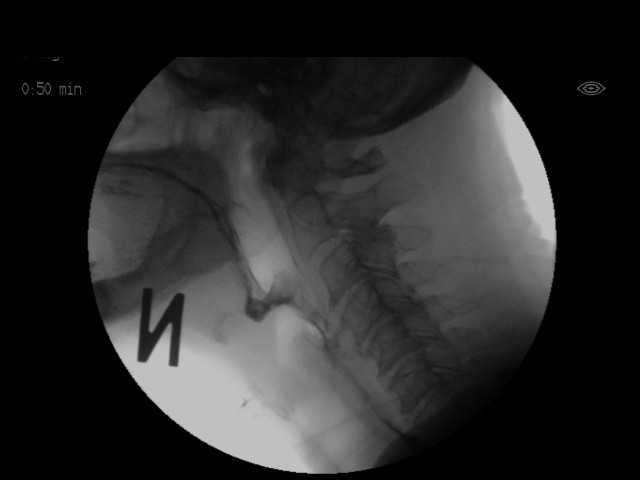
[im 12/22]
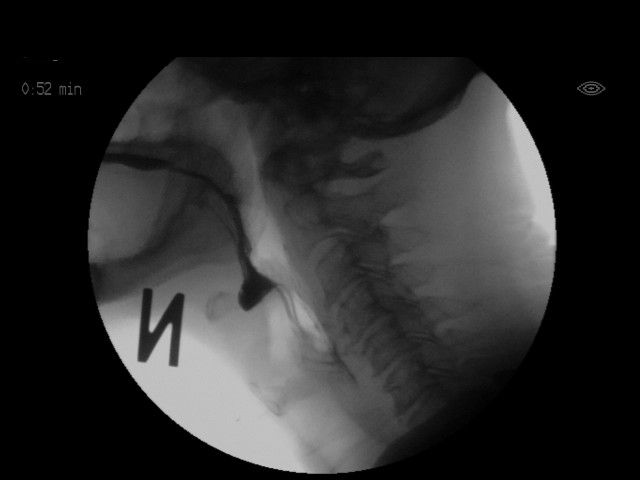
[im 14/22]
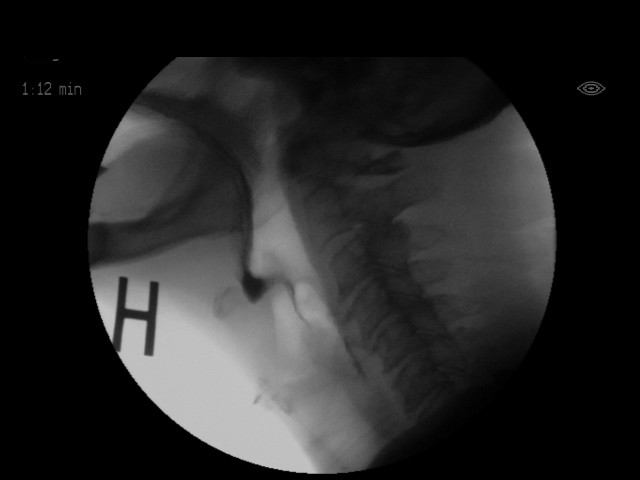
[im 15/22]
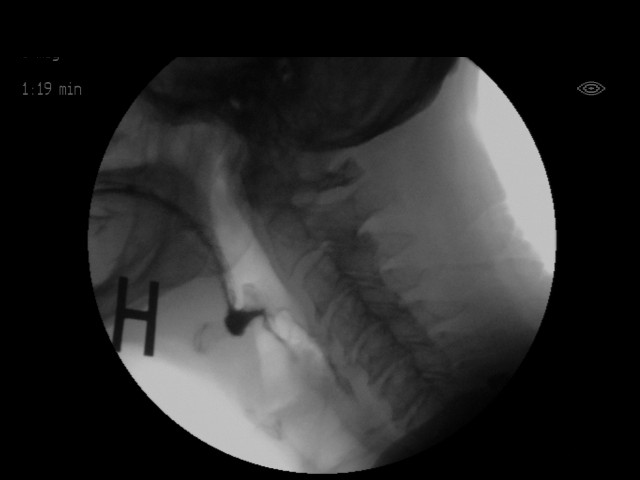
[im 16/22]
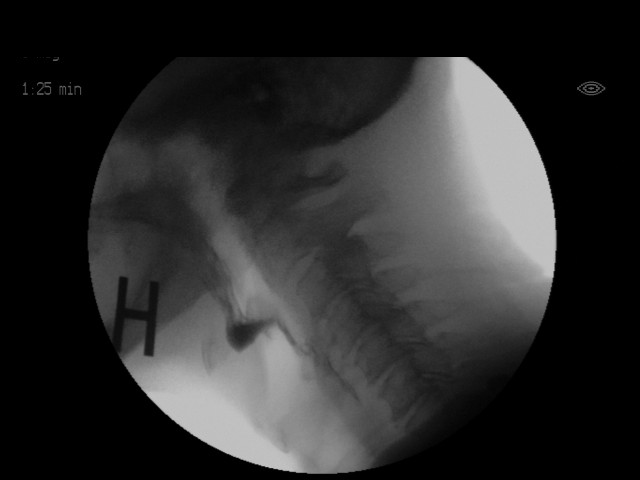
[im 18/22]
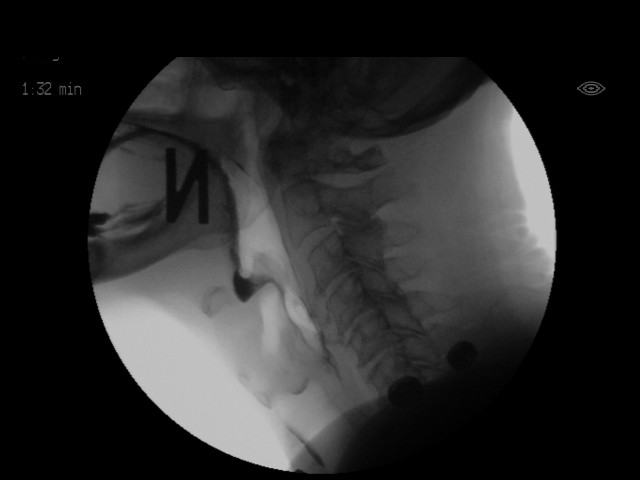
[im 19/22]
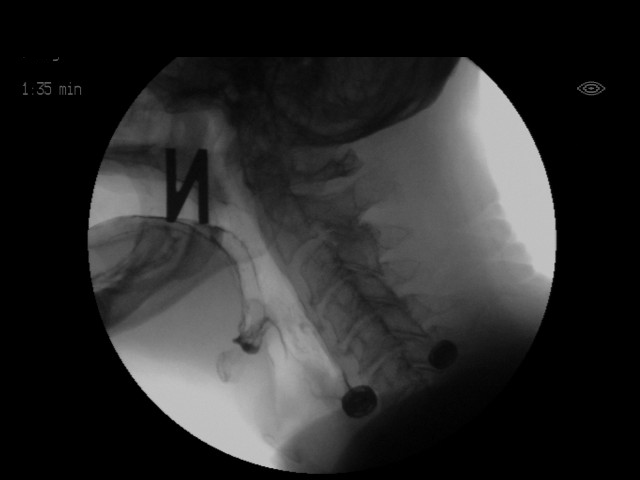
[im 20/22]
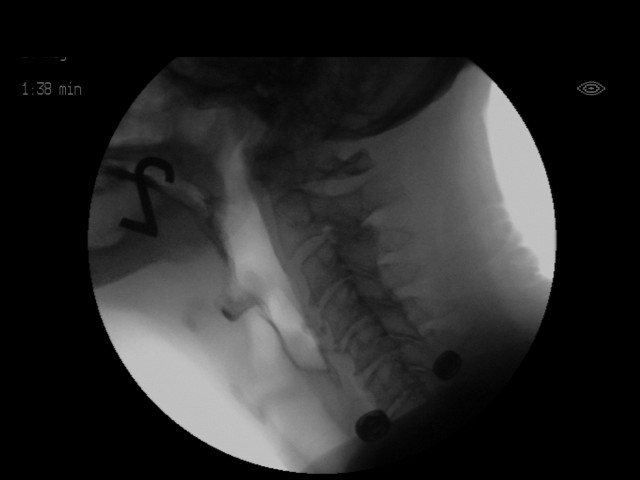
[im 21/22]
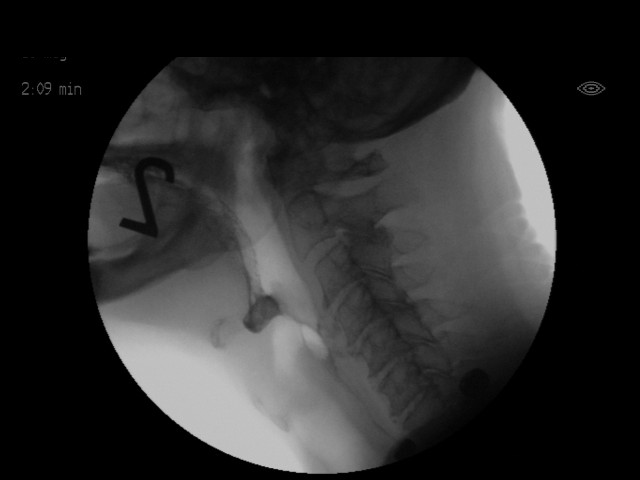
[im 22/22]
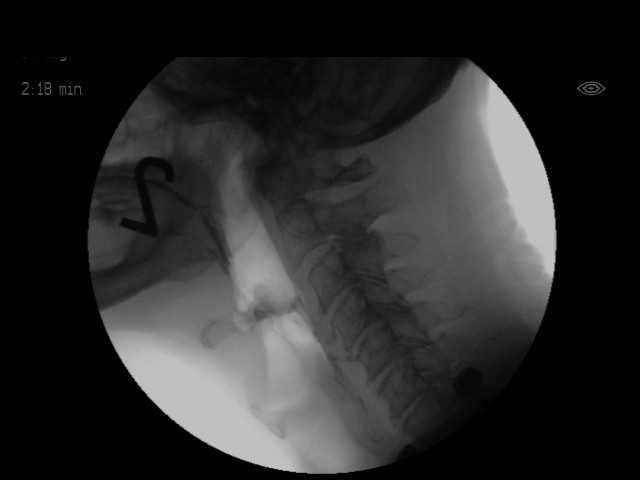

[18 of 24 positions shown; findings below may reference images not displayed]

FLUOROSCOPY FOR SWALLOWING FUNCTION STUDY:
Fluoroscopy was provided for swallowing function study, which was administered by a speech pathologist.  Final results and recommendations from this study are contained within the speech pathology report.
